# Patient Record
Sex: Female | Born: 1955 | Race: White | Hispanic: No | Marital: Married | Smoking: Current every day smoker
Health system: Southern US, Community
[De-identification: ages and names within clinical notes are randomized; demographics above are authoritative.]

## PROBLEM LIST (undated history)

## (undated) DIAGNOSIS — Z8614 Personal history of Methicillin resistant Staphylococcus aureus infection: Secondary | ICD-10-CM

## (undated) DIAGNOSIS — C801 Malignant (primary) neoplasm, unspecified: Secondary | ICD-10-CM

## (undated) DIAGNOSIS — I10 Essential (primary) hypertension: Secondary | ICD-10-CM

## (undated) DIAGNOSIS — I471 Supraventricular tachycardia, unspecified: Secondary | ICD-10-CM

## (undated) DIAGNOSIS — R112 Nausea with vomiting, unspecified: Secondary | ICD-10-CM

## (undated) DIAGNOSIS — N200 Calculus of kidney: Secondary | ICD-10-CM

## (undated) DIAGNOSIS — E119 Type 2 diabetes mellitus without complications: Secondary | ICD-10-CM

## (undated) DIAGNOSIS — Z9889 Other specified postprocedural states: Secondary | ICD-10-CM

## (undated) DIAGNOSIS — J449 Chronic obstructive pulmonary disease, unspecified: Secondary | ICD-10-CM

## (undated) DIAGNOSIS — T361X5A Adverse effect of cephalosporins and other beta-lactam antibiotics, initial encounter: Secondary | ICD-10-CM

## (undated) DIAGNOSIS — Z87442 Personal history of urinary calculi: Secondary | ICD-10-CM

## (undated) HISTORY — PX: ABDOMINAL HYSTERECTOMY: SHX81

## (undated) HISTORY — DX: Adverse effect of cephalosporins and other beta-lactam antibiotics, initial encounter: T36.1X5A

## (undated) HISTORY — DX: Chronic obstructive pulmonary disease, unspecified: J44.9

## (undated) HISTORY — PX: HERNIA REPAIR: SHX51

## (undated) HISTORY — PX: CHOLECYSTECTOMY: SHX55

## (undated) HISTORY — DX: Calculus of kidney: N20.0

## (undated) HISTORY — PX: OTHER SURGICAL HISTORY: SHX169

---

## 2005-03-16 ENCOUNTER — Ambulatory Visit: Payer: Self-pay | Admitting: Family Medicine

## 2006-09-14 ENCOUNTER — Emergency Department: Payer: Self-pay | Admitting: Emergency Medicine

## 2007-06-24 ENCOUNTER — Ambulatory Visit: Payer: Self-pay | Admitting: Internal Medicine

## 2008-02-20 ENCOUNTER — Ambulatory Visit: Payer: Self-pay | Admitting: Oncology

## 2008-02-23 ENCOUNTER — Ambulatory Visit: Payer: Self-pay | Admitting: Oncology

## 2008-03-22 ENCOUNTER — Ambulatory Visit: Payer: Self-pay | Admitting: Oncology

## 2008-05-22 ENCOUNTER — Ambulatory Visit: Payer: Self-pay | Admitting: Oncology

## 2008-05-28 ENCOUNTER — Ambulatory Visit: Payer: Self-pay | Admitting: Oncology

## 2008-05-31 ENCOUNTER — Ambulatory Visit: Payer: Self-pay | Admitting: Oncology

## 2008-06-11 ENCOUNTER — Ambulatory Visit: Payer: Self-pay | Admitting: Family Medicine

## 2008-06-22 ENCOUNTER — Ambulatory Visit: Payer: Self-pay | Admitting: Oncology

## 2008-08-18 ENCOUNTER — Ambulatory Visit: Payer: Self-pay | Admitting: Surgery

## 2008-08-26 ENCOUNTER — Ambulatory Visit: Payer: Self-pay | Admitting: Surgery

## 2008-11-26 ENCOUNTER — Ambulatory Visit: Payer: Self-pay | Admitting: Gastroenterology

## 2009-11-03 ENCOUNTER — Ambulatory Visit: Payer: Self-pay | Admitting: Family Medicine

## 2010-06-18 ENCOUNTER — Emergency Department: Payer: Self-pay | Admitting: Emergency Medicine

## 2011-11-27 ENCOUNTER — Ambulatory Visit: Payer: Self-pay | Admitting: Family Medicine

## 2011-11-29 ENCOUNTER — Ambulatory Visit: Payer: Self-pay | Admitting: Family Medicine

## 2012-02-04 ENCOUNTER — Ambulatory Visit: Payer: Self-pay | Admitting: Surgery

## 2012-02-11 ENCOUNTER — Ambulatory Visit: Payer: Self-pay | Admitting: Gastroenterology

## 2012-02-12 LAB — PATHOLOGY REPORT

## 2013-03-02 ENCOUNTER — Emergency Department: Payer: Self-pay | Admitting: Emergency Medicine

## 2013-03-02 LAB — BASIC METABOLIC PANEL
Anion Gap: 5 — ABNORMAL LOW (ref 7–16)
BUN: 14 mg/dL (ref 7–18)
Calcium, Total: 9.9 mg/dL (ref 8.5–10.1)
Chloride: 103 mmol/L (ref 98–107)
Co2: 29 mmol/L (ref 21–32)
Creatinine: 0.83 mg/dL (ref 0.60–1.30)
EGFR (African American): >60
EGFR (Non-African Amer.): >60
Glucose: 195 mg/dL — ABNORMAL HIGH (ref 65–99)
Sodium: 137 mmol/L (ref 136–145)

## 2013-03-02 LAB — URINALYSIS, COMPLETE
Bacteria: NONE SEEN
Glucose,UR: NEGATIVE mg/dL (ref 0–75)
Ph: 6 (ref 4.5–8.0)
Protein: 100
RBC,UR: 2883 /HPF (ref 0–5)
Specific Gravity: 1.024 (ref 1.003–1.030)
Squamous Epithelial: 5
WBC UR: 22 /HPF (ref 0–5)

## 2013-03-02 LAB — CBC
HCT: 41.6 % (ref 35.0–47.0)
MCH: 30.4 pg (ref 26.0–34.0)
MCHC: 35.1 g/dL (ref 32.0–36.0)
Platelet: 119 10*3/uL — ABNORMAL LOW (ref 150–440)
WBC: 11.4 10*3/uL — ABNORMAL HIGH (ref 3.6–11.0)

## 2013-03-05 ENCOUNTER — Ambulatory Visit: Payer: Self-pay | Admitting: Urology

## 2013-06-04 ENCOUNTER — Ambulatory Visit: Payer: Self-pay | Admitting: Surgery

## 2013-07-30 DIAGNOSIS — N200 Calculus of kidney: Secondary | ICD-10-CM | POA: Insufficient documentation

## 2014-02-16 DIAGNOSIS — R928 Other abnormal and inconclusive findings on diagnostic imaging of breast: Secondary | ICD-10-CM | POA: Insufficient documentation

## 2014-07-13 ENCOUNTER — Ambulatory Visit: Payer: Self-pay | Admitting: Surgery

## 2015-04-14 ENCOUNTER — Encounter: Payer: Self-pay | Admitting: Emergency Medicine

## 2015-04-14 ENCOUNTER — Ambulatory Visit
Admission: EM | Admit: 2015-04-14 | Discharge: 2015-04-14 | Disposition: A | Discharge status: Home / Self Care | Payer: 59 | Attending: Internal Medicine | Admitting: Internal Medicine

## 2015-04-14 ENCOUNTER — Ambulatory Visit: Payer: 59

## 2015-04-14 DIAGNOSIS — M4682 Other specified inflammatory spondylopathies, cervical region: Secondary | ICD-10-CM | POA: Diagnosis not present

## 2015-04-14 DIAGNOSIS — M4722 Other spondylosis with radiculopathy, cervical region: Secondary | ICD-10-CM | POA: Diagnosis not present

## 2015-04-14 DIAGNOSIS — Z794 Long term (current) use of insulin: Secondary | ICD-10-CM | POA: Diagnosis not present

## 2015-04-14 DIAGNOSIS — M549 Dorsalgia, unspecified: Secondary | ICD-10-CM | POA: Diagnosis present

## 2015-04-14 DIAGNOSIS — M4692 Unspecified inflammatory spondylopathy, cervical region: Secondary | ICD-10-CM

## 2015-04-14 DIAGNOSIS — Z79899 Other long term (current) drug therapy: Secondary | ICD-10-CM | POA: Insufficient documentation

## 2015-04-14 DIAGNOSIS — E119 Type 2 diabetes mellitus without complications: Secondary | ICD-10-CM | POA: Insufficient documentation

## 2015-04-14 DIAGNOSIS — M5412 Radiculopathy, cervical region: Secondary | ICD-10-CM

## 2015-04-14 DIAGNOSIS — Z7982 Long term (current) use of aspirin: Secondary | ICD-10-CM | POA: Diagnosis not present

## 2015-04-14 DIAGNOSIS — F17210 Nicotine dependence, cigarettes, uncomplicated: Secondary | ICD-10-CM | POA: Diagnosis not present

## 2015-04-14 HISTORY — DX: Type 2 diabetes mellitus without complications: E11.9

## 2015-04-14 MED ORDER — NAPROXEN 500 MG PO TABS: 500.0000 mg | ORAL_TABLET | Freq: Two times a day (BID) | ORAL | Status: DC

## 2015-04-14 MED ORDER — DIAZEPAM 2 MG PO TABS: 2.0000 mg | ORAL_TABLET | Freq: Three times a day (TID) | ORAL | Status: DC

## 2015-04-14 MED ORDER — KETOROLAC TROMETHAMINE 60 MG/2ML IM SOLN
60.0000 mg | Freq: Once | INTRAMUSCULAR | Status: AC
  Administered 2015-04-14: 60 mg via INTRAMUSCULAR

## 2015-04-14 NOTE — Discharge Instructions (Signed)

## 2015-04-14 NOTE — ED Notes (Signed)
Patient c/o neck pain that goes down into her upper left side of her back and left shoulder for a week.  Patient states that she thinks she might have pulled a muscle.

## 2015-04-14 NOTE — ED Provider Notes (Signed)
CSN: [id number]     Arrival date & time 04/14/15  1209 History   First MD Initiated Contact with Patient 04/14/15 1302     Chief Complaint  Patient presents with  . 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 He states that bout one week ago 350 pound patient had fainted and she assisted in helping to move the patient. She felt neck pain at that time it was not severe. She then had her grandson who is one half years old visit her in the process of frequent lifting of him she noticed an exacerbation of her pain. Now her pain is in her neck mostly left-sided but she feels it mostly in the left trapezius with some extension into the shoulder but not down into her elbow level. Denies any numbness or tingling into her fingers. Is not as noticed any decrease in strength or clumsiness. Turning her head to her left definitely makes her pain more noticeable. She states that she had injured the right side of her neck in the past had sought chiropractic which after numerous visits was able to become pain-free without recurrence. Past Medical History  Diagnosis Date  . Diabetes mellitus without complication    Past Surgical History  Procedure Laterality Date  . Cholecystectomy    . Hernia repair    . Abdominal hysterectomy    . Cesarean section     History reviewed. No pertinent family history. History  Substance Use Topics  . Smoking status: Current Every Day Smoker -- 1.00 packs/day  . Smokeless tobacco: Never Used  . Alcohol Use: No   OB History    No data available     Review of Systems  Musculoskeletal: Positive for myalgias, neck pain and neck stiffness.  All other systems reviewed and are negative.   Allergies  Review of patient's allergies indicates no known allergies.  Home Medications   Prior to Admission medications    Medication Sig Start Date End Date Taking? Authorizing Provider  aspirin 81 MG tablet Take 81 mg by mouth daily.   Yes Historical Provider, MD  atorvastatin (LIPITOR) 40 MG tablet Take 40 mg by mouth daily.   Yes Historical Provider, MD  b complex vitamins tablet Take 1 tablet by mouth daily.   Yes Historical Provider, MD  enalapril (VASOTEC) 2.5 MG tablet Take 2.5 mg by mouth daily.   Yes Historical Provider, MD  glipiZIDE (GLUCOTROL XL) 10 MG 24 hr tablet Take 10 mg by mouth daily with breakfast.   Yes Historical Provider, MD  Insulin Detemir (LEVEMIR FLEXPEN) 100 UNIT/ML Pen Inject 50 Units into the skin daily at 10 pm.   Yes Historical Provider, MD  insulin lispro (HUMALOG) 100 UNIT/ML injection Inject 10 Units into the skin once.   Yes Historical Provider, MD  metFORMIN (GLUCOPHAGE-XR) 500 MG 24 hr tablet Take 1,500 mg by mouth daily with breakfast.   Yes Historical Provider, MD  diazepam (VALIUM) 2 MG tablet Take 1 tablet (2 mg total) by mouth 3 (three) times daily. 04/14/15   Lorin Picket, PA-C  naproxen (NAPROSYN) 500 MG tablet Take 1 tablet (500 mg total) by mouth 2 (two) times daily with a meal. 04/14/15   Lorin Picket, PA-C   BP 136/73 mmHg  Pulse 89  Temp(Src) 96.9 F (36.1 C) (Tympanic)  Resp 16  Ht 5\' 3"  (1.6 m)  Wt 170 lb (77.111 kg)  BMI 30.12 kg/m2  SpO2 98% Physical Exam  Constitutional: She is oriented to person, place, and time. She appears well-developed and well-nourished.  HENT:  Head: Normocephalic and atraumatic.  Right Ear: External ear normal.  Left Ear: External ear normal.  Eyes: EOM are normal. Pupils are equal, round, and reactive to light.  Neck:  Examination of neck shows limited range of motion particularly with extension and left-sided rotation. Most pain is elicited with flexion and extension which causes her to have pain in the left trapezius. Upper extremity DTRs are 2+ over 4 and bilaterally symmetrical. Strength is intact to clinical  stressing and sensation is intact to light touch. There is muscle spasm present and tenderness over small area of the trapezius which when pressed reproduces her most significant pain. She perceives some radiation into her shoulder with this maneuver.  Neurological: She is alert and oriented to person, place, and time. She has normal reflexes.  Skin: Skin is warm.  Psychiatric: She has a normal mood and affect. Her behavior is normal. Judgment and thought content normal.  Nursing note and vitals reviewed.   ED Course  Procedures (including critical care time) Labs Review Labs Reviewed - No data to display  Imaging Review Dg Cervical Spine Complete  04/14/2015   CLINICAL DATA:  Injured lifting with neck pain  EXAM: CERVICAL SPINE  4+ VIEWS  COMPARISON:  None.  FINDINGS: The cervical vertebrae are slightly straightened in alignment. There is degenerative disc disease at C6-7 where there is loss of disc space and sclerosis with spurring. The remainder of intervertebral disc spaces appear normal. No prevertebral soft tissue swelling is seen. On oblique views, there is moderate foraminal narrowing bilaterally at C6-7. The odontoid process is intact. The lung apices are clear.  IMPRESSION: Straightened alignment with degenerative disc disease at C6-7 with moderate bilateral foraminal narrowing at that level.   Electronically Signed   By: Ivar Drape M.D.   On: 04/14/2015 13:43   13:18:43 Orders Placed WR  ketorolac (TORADOL) injection 60 mg ; DG Cervical Spine Complete       MDM   1. Cervical spondylitis with radiculitis    New Prescriptions   DIAZEPAM (VALIUM) 2 MG TABLET    Take 1 tablet (2 mg total) by mouth 3 (three) times daily.   NAPROXEN (NAPROSYN) 500 MG TABLET    Take 1 tablet (500 mg total) by mouth 2 (two) times daily with a meal.  Plan: 1. Test/x-ray results and diagnosis reviewed with patient 2. rx as per orders; risks, benefits, potential side effects reviewed with patient 3.  Recommend supportive treatment with rest,heat 4. F/u prn if symptoms worsen or don't improve. F/U PCP     Lorin Picket, PA-C 04/14/15 1404

## 2015-04-16 ENCOUNTER — Encounter: Payer: Self-pay | Admitting: Emergency Medicine

## 2015-04-16 ENCOUNTER — Emergency Department: Payer: 59

## 2015-04-16 ENCOUNTER — Emergency Department
Admission: EM | Admit: 2015-04-16 | Discharge: 2015-04-16 | Disposition: A | Discharge status: Home / Self Care | Payer: 59 | Attending: Emergency Medicine | Admitting: Emergency Medicine

## 2015-04-16 DIAGNOSIS — Z7952 Long term (current) use of systemic steroids: Secondary | ICD-10-CM | POA: Diagnosis not present

## 2015-04-16 DIAGNOSIS — Z720 Tobacco use: Secondary | ICD-10-CM | POA: Insufficient documentation

## 2015-04-16 DIAGNOSIS — Z794 Long term (current) use of insulin: Secondary | ICD-10-CM | POA: Diagnosis not present

## 2015-04-16 DIAGNOSIS — Z7982 Long term (current) use of aspirin: Secondary | ICD-10-CM | POA: Insufficient documentation

## 2015-04-16 DIAGNOSIS — M2550 Pain in unspecified joint: Secondary | ICD-10-CM

## 2015-04-16 DIAGNOSIS — Z79899 Other long term (current) drug therapy: Secondary | ICD-10-CM | POA: Diagnosis not present

## 2015-04-16 DIAGNOSIS — M542 Cervicalgia: Secondary | ICD-10-CM | POA: Insufficient documentation

## 2015-04-16 DIAGNOSIS — E119 Type 2 diabetes mellitus without complications: Secondary | ICD-10-CM | POA: Insufficient documentation

## 2015-04-16 MED ORDER — PREDNISONE 10 MG PO TABS: 50.0000 mg | ORAL_TABLET | Freq: Every day | ORAL | Status: DC

## 2015-04-16 MED ORDER — IBUPROFEN 800 MG PO TABS: 800.0000 mg | ORAL_TABLET | Freq: Three times a day (TID) | ORAL | Status: DC | PRN

## 2015-04-16 MED ORDER — MEPERIDINE HCL 50 MG/ML IJ SOLN
50.0000 mg | Freq: Once | INTRAMUSCULAR | Status: AC
  Administered 2015-04-16: 50 mg via INTRAMUSCULAR

## 2015-04-16 MED ORDER — PROMETHAZINE HCL 25 MG/ML IJ SOLN
25.0000 mg | Freq: Once | INTRAMUSCULAR | Status: AC
  Administered 2015-04-16: 25 mg via INTRAMUSCULAR

## 2015-04-16 MED ORDER — HYDROCODONE-ACETAMINOPHEN 5-325 MG PO TABS: 1.0000 | ORAL_TABLET | ORAL | Status: DC | PRN

## 2015-04-16 MED ORDER — CYCLOBENZAPRINE HCL 10 MG PO TABS: 10.0000 mg | ORAL_TABLET | Freq: Three times a day (TID) | ORAL | Status: DC | PRN

## 2015-04-16 MED ORDER — DIAZEPAM 5 MG PO TABS
5.0000 mg | ORAL_TABLET | Freq: Once | ORAL | Status: AC
  Administered 2015-04-16: 5 mg via ORAL

## 2015-04-16 MED ORDER — PROMETHAZINE HCL 25 MG/ML IJ SOLN
INTRAMUSCULAR | Status: AC
  Administered 2015-04-16: 25 mg via INTRAMUSCULAR
  Filled 2015-04-16: qty 1

## 2015-04-16 MED ORDER — DIAZEPAM 5 MG PO TABS
ORAL_TABLET | ORAL | Status: AC
  Administered 2015-04-16: 5 mg via ORAL
  Filled 2015-04-16: qty 1

## 2015-04-16 MED ORDER — MEPERIDINE HCL 50 MG/ML IJ SOLN
INTRAMUSCULAR | Status: AC
  Administered 2015-04-16: 50 mg via INTRAMUSCULAR
  Filled 2015-04-16: qty 1

## 2015-04-16 NOTE — ED Provider Notes (Signed)
[facility] Emergency Department Provider Note  ____________________________________________  Time seen: Approximately 8:09 AM  I have reviewed the triage vital signs and the nursing notes.   HISTORY  Chief Complaint Neck Pain    HPI Dawn Farrell is a 59 y.o. female who presents to the emergency room for evaluation of continued neck and left shoulder pain. Patient was seen at urgent care 2 days ago for same given Valium and naproxen with no relief. Patient states pain is 10 over 10 unable to sleep last night. Denies any chest pains, nausea, vomiting,.   Past Medical History  Diagnosis Date  . Diabetes mellitus without complication     There are no active problems to display for this patient.   Past Surgical History  Procedure Laterality Date  . Cholecystectomy    . Hernia repair    . Abdominal hysterectomy    . Cesarean section      Current Outpatient Rx  Name  Route  Sig  Dispense  Refill  . aspirin 81 MG tablet   Oral   Take 81 mg by mouth daily.         [name] atorvastatin (LIPITOR) 40 MG tablet   Oral   Take 40 mg by mouth daily.         [name] b complex vitamins tablet   Oral   Take 1 tablet by mouth daily.         . cyclobenzaprine (FLEXERIL) 10 MG tablet   Oral   Take 1 tablet (10 mg total) by mouth every 8 (eight) hours as needed for muscle spasms.   30 tablet   1   . enalapril (VASOTEC) 2.5 MG tablet   Oral   Take 2.5 mg by mouth daily.         [name] glipiZIDE (GLUCOTROL XL) 10 MG 24 hr tablet   Oral   Take 10 mg by mouth daily with breakfast.         . HYDROcodone-acetaminophen (NORCO) 5-325 MG per tablet   Oral   Take 1-2 tablets by mouth every 4 (four) hours as needed for moderate pain.   15 tablet   0   . ibuprofen (ADVIL,MOTRIN) 800 MG tablet   Oral   Take 1 tablet (800 mg total) by mouth every 8 (eight) hours as needed.   30 tablet   0   . Insulin Detemir (LEVEMIR FLEXPEN) 100 UNIT/ML Pen   Subcutaneous  Inject 50 Units into the skin daily at 10 pm.         . insulin lispro (HUMALOG) 100 UNIT/ML injection   Subcutaneous   Inject 10 Units into the skin once.         . metFORMIN (GLUCOPHAGE-XR) 500 MG 24 hr tablet   Oral   Take 1,500 mg by mouth daily with breakfast.         . predniSONE (DELTASONE) 10 MG tablet   Oral   Take 5 tablets (50 mg total) by mouth daily with breakfast.   50 tablet   0     Allergies Review of patient's allergies indicates no known allergies.  No family history on file.  Social History History  Substance Use Topics  . Smoking status: Current Every Day Smoker -- 1.00 packs/day  . Smokeless tobacco: Never Used  . Alcohol Use: No    Review of Systems Constitutional: No fever/chills Eyes: No visual changes. ENT: No sore throat. Cardiovascular: Denies chest pain. Respiratory: Denies shortness of breath.  Gastrointestinal: No abdominal pain.  No nausea, no vomiting.  No diarrhea.  No constipation. Genitourinary: Negative for dysuria. Musculoskeletal: Positive for left scapular paraspinal cervical pain. Skin: Negative for rash. Neurological: Negative for headaches, focal weakness or numbness.  10-point ROS otherwise negative.  ____________________________________________   PHYSICAL EXAM:  VITAL SIGNS: ED Triage Vitals  Enc Vitals Group     BP 04/16/15 0756 154/87 mmHg     Pulse Rate 04/16/15 0756 83     Resp 04/16/15 0756 18     Temp 04/16/15 0756 98 F (36.7 C)     Temp Source 04/16/15 0756 Oral     SpO2 04/16/15 0756 97 %     Weight 04/16/15 0756 170 lb (77.111 kg)     Height 04/16/15 0756 5\' 3"  (1.6 m)     Head Cir --      Peak Flow --      Pain Score 04/16/15 0756 8     Pain Loc --      Pain Edu? --      Excl. in [place]? --     Constitutional: Alert and oriented. Well appearing and in no acute distress. Eyes: Conjunctivae are normal. PERRL. EOMI. Head: Atraumatic. Nose: No congestion/rhinnorhea. Mouth/Throat: Mucous  membranes are moist.  Oropharynx non-erythematous. Neck: No stridor.  No spinal tenderness. Cardiovascular: Normal rate, regular rhythm. Grossly normal heart sounds.  Good peripheral circulation. Respiratory: Normal respiratory effort.  No retractions. Lungs CTAB. Gastrointestinal: Soft and nontender. No distention. No abdominal bruits. No CVA tenderness. Musculoskeletal: No lower extremity tenderness nor edema.  No joint effusions. Increased pain with flexion and extension of left arm and shoulder. Neurologic:  Normal speech and language. No gross focal neurologic deficits are appreciated. Speech is normal. No gait instability. Skin:  Skin is warm, dry and intact. No rash noted. Psychiatric: Mood and affect are normal. Speech and behavior are normal.  ____________________________________________   LABS (all labs ordered are listed, but only abnormal results are displayed)  Labs Reviewed - No data to display ____________________________________________  EKG  Not applicable ____________________________________________  RADIOLOGY  Cervical spine Results reviewed from 6/23 by myself. Left shoulder x-rays interpreted by myself as negative. ____________________________________________   PROCEDURES  Procedure(s) performed: None  10:00-After 30 minutes, reexamined patient no relief with Demerol and Phenergan. Valium 5 mg by mouth ordered as well as left shoulder x-rays. 1030-she reports improvement. We'll discharge home.   Critical Care performed: No  ____________________________________________   INITIAL IMPRESSION / ASSESSMENT AND PLAN / ED COURSE  Pertinent labs & imaging results that were available during my care of the patient were reviewed by me and considered in my medical decision making (see chart for details).  Chronic arthralgia exacerbation acutely. Rx given for Flexeril 10 mg, Motrin 800 mg, prednisone, and hydrocodone.  Patient verbalizes understanding and will  follow-up with any worsening symptomology. She denies any other emergency medical complaints at this visit. ____________________________________________   FINAL CLINICAL IMPRESSION(S) / ED DIAGNOSES  Final diagnoses:  Arthralgia      Arlyss Repress, PA-C 04/16/15 [place], MD 04/16/15 1550

## 2015-04-16 NOTE — Discharge Instructions (Signed)

## 2015-04-16 NOTE — ED Notes (Signed)
Pt states about 1 week ago she was moving a patient at work and then she went to pick up her grandson, states that night she started having posterior neck pain, states she had xray preformed on Thursday and [facility] and dx with "pinched nerve" in her neck, states pain is getting worse and now is radiating down to left elbow area, states pain is worse with movement

## 2015-04-16 NOTE — ED Notes (Signed)
States she was seen for pain to neck couple of days ago. Now states pain is moving from neck into left arm.

## 2015-05-11 DIAGNOSIS — M5012 Mid-cervical disc disorder, unspecified level: Secondary | ICD-10-CM | POA: Insufficient documentation

## 2016-04-23 ENCOUNTER — Encounter: Payer: Self-pay | Admitting: *Deleted

## 2016-04-23 ENCOUNTER — Ambulatory Visit
Admission: EM | Admit: 2016-04-23 | Discharge: 2016-04-23 | Disposition: A | Discharge status: Hospice | Payer: 59 | Attending: Family Medicine | Admitting: Family Medicine

## 2016-04-23 ENCOUNTER — Emergency Department
Admission: EM | Admit: 2016-04-23 | Discharge: 2016-04-23 | Disposition: A | Discharge status: Home / Self Care | Payer: 59 | Attending: Emergency Medicine | Admitting: Emergency Medicine

## 2016-04-23 DIAGNOSIS — E876 Hypokalemia: Secondary | ICD-10-CM

## 2016-04-23 DIAGNOSIS — I471 Supraventricular tachycardia, unspecified: Secondary | ICD-10-CM

## 2016-04-23 DIAGNOSIS — R079 Chest pain, unspecified: Secondary | ICD-10-CM

## 2016-04-23 DIAGNOSIS — Z7982 Long term (current) use of aspirin: Secondary | ICD-10-CM | POA: Diagnosis not present

## 2016-04-23 DIAGNOSIS — Z794 Long term (current) use of insulin: Secondary | ICD-10-CM | POA: Insufficient documentation

## 2016-04-23 DIAGNOSIS — Z7984 Long term (current) use of oral hypoglycemic drugs: Secondary | ICD-10-CM | POA: Diagnosis not present

## 2016-04-23 DIAGNOSIS — E119 Type 2 diabetes mellitus without complications: Secondary | ICD-10-CM | POA: Diagnosis not present

## 2016-04-23 DIAGNOSIS — F17200 Nicotine dependence, unspecified, uncomplicated: Secondary | ICD-10-CM | POA: Diagnosis not present

## 2016-04-23 DIAGNOSIS — R002 Palpitations: Secondary | ICD-10-CM | POA: Diagnosis present

## 2016-04-23 LAB — CBC WITH DIFFERENTIAL/PLATELET
BASOS ABS: 0.1 10*3/uL (ref 0–0.1)
Basophils Relative: 1 %
Eosinophils Absolute: 0.5 10*3/uL (ref 0–0.7)
Eosinophils Relative: 5 %
HEMATOCRIT: 42.1 % (ref 35.0–47.0)
Hemoglobin: 14.3 g/dL (ref 12.0–16.0)
LYMPHS ABS: 2.4 10*3/uL (ref 1.0–3.6)
Lymphocytes Relative: 25 %
MCH: 29.1 pg (ref 26.0–34.0)
MCHC: 34 g/dL (ref 32.0–36.0)
MCV: 85.4 fL (ref 80.0–100.0)
MONO ABS: 0.5 10*3/uL (ref 0.2–0.9)
Monocytes Relative: 5 %
NEUTROS ABS: 5.9 10*3/uL (ref 1.4–6.5)
Neutrophils Relative %: 64 %
Platelets: 121 10*3/uL — ABNORMAL LOW (ref 150–440)
RBC: 4.93 MIL/uL (ref 3.80–5.20)
RDW: 14.1 % (ref 11.5–14.5)
WBC: 9.3 10*3/uL (ref 3.6–11.0)

## 2016-04-23 LAB — BASIC METABOLIC PANEL
ANION GAP: 6 (ref 5–15)
BUN: 15 mg/dL (ref 6–20)
CHLORIDE: 111 mmol/L (ref 101–111)
CO2: 22 mmol/L (ref 22–32)
CREATININE: 0.67 mg/dL (ref 0.44–1.00)
Calcium: 7.9 mg/dL — ABNORMAL LOW (ref 8.9–10.3)
GFR calc non Af Amer: >60 mL/min (ref >60)
Glucose, Bld: 184 mg/dL — ABNORMAL HIGH (ref 65–99)
POTASSIUM: 3 mmol/L — AB (ref 3.5–5.1)
SODIUM: 139 mmol/L (ref 135–145)

## 2016-04-23 LAB — BRAIN NATRIURETIC PEPTIDE: B Natriuretic Peptide: 16 pg/mL (ref 0.0–100.0)

## 2016-04-23 LAB — TROPONIN I

## 2016-04-23 MED ORDER — POTASSIUM CHLORIDE CRYS ER 20 MEQ PO TBCR
40.0000 meq | EXTENDED_RELEASE_TABLET | Freq: Once | ORAL | Status: AC
  Administered 2016-04-23: 40 meq via ORAL
  Filled 2016-04-23: qty 2

## 2016-04-23 MED ORDER — ADENOSINE 6 MG/2ML IV SOLN
6.0000 mg | Freq: Once | INTRAVENOUS | Status: AC
  Administered 2016-04-23: 6 mg via INTRAVENOUS

## 2016-04-23 MED ORDER — METOPROLOL SUCCINATE ER 50 MG PO TB24
50.0000 mg | ORAL_TABLET | Freq: Every day | ORAL | Status: DC
  Administered 2016-04-23: 50 mg via ORAL
  Filled 2016-04-23: qty 1

## 2016-04-23 MED ORDER — METOPROLOL SUCCINATE ER 50 MG PO TB24: 50.0000 mg | ORAL_TABLET | Freq: Every day | ORAL | Status: DC

## 2016-04-23 MED ORDER — ASPIRIN 81 MG PO CHEW
324.0000 mg | CHEWABLE_TABLET | Freq: Once | ORAL | Status: AC
  Administered 2016-04-23: 324 mg via ORAL

## 2016-04-23 MED ORDER — SODIUM CHLORIDE 0.9 % IV BOLUS (SEPSIS)
1000.0000 mL | Freq: Once | INTRAVENOUS | Status: AC
  Administered 2016-04-23: 1000 mL via INTRAVENOUS

## 2016-04-23 NOTE — ED Notes (Signed)
Iv fluids infusing.   Family with pt   meds given.

## 2016-04-23 NOTE — Discharge Instructions (Signed)
Paroxysmal Supraventricular Tachycardia Paroxysmal supraventricular tachycardia (PSVT) is when your heart beats very quickly and then suddenly stops beating so quickly. You may or may not have any symptoms when this occurs. It is usually not dangerous. It can lead to problems if it happens often or it lasts a long time. HOME CARE   Take medicines only as told by your doctor.  Avoid caffeine or limit how much of it you consume as told by your doctor. Caffeine is found in coffee, tea, soda, and chocolate.  Avoid alcohol or limit how much of it you drink as told by your doctor.  Do not smoke.  Try to get at least 7 hours of sleep each night.  Find healthy ways to reduce stress.  Do self-treatments as told by your doctor to slow down your heart (vagus nerve stimulation). Your doctor may tell you to:  Hold your breath and push, as though you are going to the bathroom.  Rub an area on one side of your neck.  Bend forward with your head between your legs.  Bend forward with your head between your legs, then cough.  Rub your eyeballs with your eyes closed.  Maintain a healthy weight.  Get some exercise on most days. Ask your doctor about some good activities for you. GET HELP IF:  You are having episodes of a fast heartbeat more often.  Your episodes are lasting longer.  Your self-treatments to slow down your heart are no longer helping.  You have new symptoms during an episode. GET HELP RIGHT AWAY IF:  You have chest pain.  You have trouble breathing.  You have an episode of a fast heartbeat that lasts longer than 20 minutes.  You pass out (faint). These symptoms may be an emergency. Do not wait to see if the symptoms will go away. Get medical help right away. Call your local emergency services (911 in the [place] Do not drive yourself to the hospital.   This information is not intended to replace advice given to you by your health care provider. Make sure you discuss any  questions you have with your health care provider.   Document Released: 10/08/2005 Document Revised: 10/29/2014 Document Reviewed: 03/18/2014 Elsevier Interactive Patient Education 2016 [employer].  Hypokalemia Hypokalemia means that the amount of potassium in the blood is lower than normal.Potassium is a chemical, called an electrolyte, that helps regulate the amount of fluid in the body. It also stimulates muscle contraction and helps nerves function properly.Most of the body's potassium is inside of cells, and only a very small amount is in the blood. Because the amount in the blood is so small, minor changes can be life-threatening. CAUSES  Antibiotics.  Diarrhea or vomiting.  Using laxatives too much, which can cause diarrhea.  Chronic kidney disease.  Water pills (diuretics).  Eating disorders (bulimia).  Low magnesium level.  Sweating a lot. SIGNS AND SYMPTOMS  Weakness.  Constipation.  Fatigue.  Muscle cramps.  Mental confusion.  Skipped heartbeats or irregular heartbeat (palpitations).  Tingling or numbness. DIAGNOSIS  Your health care provider can diagnose hypokalemia with blood tests. In addition to checking your potassium level, your health care provider may also check other lab tests. TREATMENT Hypokalemia can be treated with potassium supplements taken by mouth or adjustments in your current medicines. If your potassium level is very low, you may need to get potassium through a vein (IV) and be monitored in the hospital. A diet high in potassium is also helpful. Foods  high in potassium are:  Nuts, such as peanuts and pistachios.  Seeds, such as sunflower seeds and pumpkin seeds.  Peas, lentils, and lima beans.  Whole grain and bran cereals and breads.  Fresh fruit and vegetables, such as apricots, avocado, bananas, cantaloupe, kiwi, oranges, tomatoes, asparagus, and potatoes.  Orange and tomato juices.  Red meats.  Fruit yogurt. HOME CARE  INSTRUCTIONS  Take all medicines as prescribed by your health care provider.  Maintain a healthy diet by including nutritious food, such as fruits, vegetables, nuts, whole grains, and lean meats.  If you are taking a laxative, be sure to follow the directions on the label. SEEK MEDICAL CARE IF:  Your weakness gets worse.  You feel your heart pounding or racing.  You are vomiting or having diarrhea.  You are diabetic and having trouble keeping your blood glucose in the normal range. SEEK IMMEDIATE MEDICAL CARE IF:  You have chest pain, shortness of breath, or dizziness.  You are vomiting or having diarrhea for more than 2 days.  You faint. MAKE SURE YOU:   Understand these instructions.  Will watch your condition.  Will get help right away if you are not doing well or get worse.   This information is not intended to replace advice given to you by your health care provider. Make sure you discuss any questions you have with your health care provider.   Document Released: 10/08/2005 Document Revised: 10/29/2014 Document Reviewed: 04/10/2013 Elsevier Interactive Patient Education [employer].

## 2016-04-23 NOTE — ED Notes (Signed)
Pt brought in by ems from [facility] urgent care.  Pt had episode of rapid heart rate.  Given 6mg  adenisone with conversion.  Pt alert. Iv in place.  md at bedside.

## 2016-04-23 NOTE — ED Provider Notes (Signed)
CSN: [id number]     Arrival date & time 04/23/16  1527 History   First MD Initiated Contact with Patient 04/23/16 1533   Nurses notes were reviewed. Chief Complaint  Patient presents with  . Tachycardia    Patient is here due to tachycardia and shortness of breath. Patient reports in the usual state of health when about 2:30 orabout an hour from her presentation here she noticed that she was having difficulty breathing and experiencing tachycardia. She reports having some mild tachycardia before in the past when she is anxious she does have a history of anxiety but never having anything like she had today. 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 Put a monitor on found her heart rate was over 190 pulse ox was 96 so she came here to be evaluated and treated. She states that normally her blood pressure about 110 highest and that was also elevated. Past history she is a diabetic. She still smokes and she's had a cholecystectomy hernia repair abdominal hysterectomy and 2 C-sections. Mother has had heart failure father has had heart failure as well.   She reports having chest pain as well which is a heaviness and fullness she describes over her chest with some radiation to her neck and a smothering sensation over her chest also. She reports also sweating heavily since she's been here.    (Consider location/radiation/quality/duration/timing/severity/associated sxs/prior Treatment) Patient is a 60 y.o. female presenting with palpitations. The history is provided by the patient. No language interpreter was used.  Palpitations Palpitations quality:  Fast Onset quality:  Sudden Duration:  2 hours Timing:  Constant Progression:  Worsening Chronicity:  New Context: anxiety   Context: not blood loss, not illicit drugs, not nicotine and not stimulant use   Relieved by:  Nothing Worsened by:  Nothing Ineffective treatments:  None tried Associated symptoms: chest pain, chest pressure, diaphoresis,  shortness of breath and weakness   Risk factors: diabetes mellitus and stress   Risk factors: no heart disease, no hx of atrial fibrillation, no hx of DVT, no hx of PE, no hyperthyroidism and no OTC sinus medications     Past Medical History  Diagnosis Date  . Diabetes mellitus without complication [facility])    Past Surgical History  Procedure Laterality Date  . Cholecystectomy    . Hernia repair    . Abdominal hysterectomy    . Cesarean section     Family History  Problem Relation Age of Onset  . Heart failure Mother   . Heart failure Father    Social History  Substance Use Topics  . Smoking status: Current Every Day Smoker -- 1.00 packs/day  . Smokeless tobacco: Never Used  . Alcohol Use: No   OB History    No data available     Review of Systems  Constitutional: Positive for diaphoresis.  Respiratory: Positive for shortness of breath.   Cardiovascular: Positive for chest pain and palpitations.  Neurological: Positive for weakness.  All other systems reviewed and are negative.   Allergies  Review of patient's allergies indicates no known allergies.  Home Medications   Prior to Admission medications   Medication Sig Start Date End Date Taking? Authorizing Provider  aspirin 81 MG tablet Take 81 mg by mouth daily.   Yes Historical Provider, MD  atorvastatin (LIPITOR) 40 MG tablet Take 40 mg by mouth daily.   Yes Historical Provider, MD  enalapril (VASOTEC) 2.5 MG tablet Take 2.5 mg by mouth daily.  Yes Historical Provider, MD  glipiZIDE (GLUCOTROL XL) 10 MG 24 hr tablet Take 10 mg by mouth daily with breakfast.   Yes Historical Provider, MD  Insulin Detemir (LEVEMIR FLEXPEN) 100 UNIT/ML Pen Inject 50 Units into the skin daily at 10 pm.   Yes Historical Provider, MD  metFORMIN (GLUCOPHAGE-XR) 500 MG 24 hr tablet Take 1,500 mg by mouth daily with breakfast.   Yes Historical Provider, MD  b complex vitamins tablet Take 1 tablet by mouth daily.    Historical Provider, MD   cyclobenzaprine (FLEXERIL) 10 MG tablet Take 1 tablet (10 mg total) by mouth every 8 (eight) hours as needed for muscle spasms. 04/16/15   Arlyss Repress, PA-C  HYDROcodone-acetaminophen (NORCO) 5-325 MG per tablet Take 1-2 tablets by mouth every 4 (four) hours as needed for moderate pain. 04/16/15   Pierce Crane Beers, PA-C  ibuprofen (ADVIL,MOTRIN) 800 MG tablet Take 1 tablet (800 mg total) by mouth every 8 (eight) hours as needed. 04/16/15   Pierce Crane Beers, PA-C  insulin lispro (HUMALOG) 100 UNIT/ML injection Inject 10 Units into the skin once.    Historical Provider, MD  predniSONE (DELTASONE) 10 MG tablet Take 5 tablets (50 mg total) by mouth daily with breakfast. 04/16/15   Arlyss Repress, PA-C   Meds Ordered and Administered this Visit   Medications  adenosine (ADENOCARD) 6 MG/2ML injection 6 mg (6 mg Intravenous Given 04/23/16 1544)  aspirin chewable tablet 324 mg (324 mg Oral Given 04/23/16 1544)    BP 146/94 mmHg  Pulse 107 No data found.   Physical Exam  Constitutional: She is oriented to person, place, and time. She appears well-developed and well-nourished.  HENT:  Head: Normocephalic.  Eyes: Pupils are equal, round, and reactive to light.  Neck: Normal range of motion. Neck supple.  Cardiovascular: An irregularly irregular rhythm present. Tachycardia present.   Pulmonary/Chest: Breath sounds normal.  Abdominal: Soft. Bowel sounds are normal.  Musculoskeletal: Normal range of motion. She exhibits no edema.  Neurological: She is alert and oriented to person, place, and time.  Skin: Skin is warm and dry.  Psychiatric: Her mood appears anxious.  Vitals reviewed.   ED Course  Procedures (including critical care time)  Labs Review Labs Reviewed - No data to display  Imaging Review No results found.   Visual Acuity Review  Right Eye Distance:   Left Eye Distance:   Bilateral Distance:    Right Eye Near:   Left Eye Near:    Bilateral Near:         MDM   1.  Sustained SVT (HCC)   2. Chest pain radiating to jaw    Patient was given 4 baby aspirin. Saline lock was started and IV adenosine was given 6 mg IV. Patient states felt like a horse kicked her course but heart rate went from 190-200 to 110 with marked improvement of her shortness of breath and resolution of the diaphoresis. EMS was notified and they have transferring her to [facility] ED charge nurse Colletta Maryland was notified of the pending arrival..   ED ECG REPORT I, Cecylia Brazill H, the attending physician, personally viewed and interpreted this ECG.   Date: 04/23/2016  EKG Time: 15:36:56  Rate: 199  Rhythm: there are no previous tracings available for comparison, prolonged QT interval, nonspecific intraventricular block  Axis:52   Intervals:nonspecific intraventricular conduction delay  ST&T Change: Marked ST depression present throughout the EKG     After the adenosine was  given she still had sinus tachycardia 110of testicular abnormalities but the ST depressions were present for her cleared.   Note: This dictation was prepared with Dragon dictation along with smaller phrase technology. Any transcriptional errors that result from this process are unintentional.    Frederich Cha, MD 04/23/16 [phone]

## 2016-04-23 NOTE — ED Provider Notes (Signed)
[facility] Emergency Department Provider Note   ____________________________________________  Time seen: seen upon arrival to the emergency department  I have reviewed the triage vital signs and the nursing notes.   HISTORY  Chief Complaint Tachycardia   HPI Dawn Farrell is a 60 y.o. female with a history of diabetes who is presenting to the emergency department today after being found to be in SVT. At about 225 she felt a sudden onset of lightheadedness as well as palpitations. Says that she also felt a heaviness to the back of her throat. She also felt weakness to her bilateral arms. Said that she thought she was just stress. However, after the feeling persisted she checked her pulse about 20 minutes later and was found to have a heart rate at about 200. She then drove to urgent care or she was found to be in SVT. She was given 6 g of adenosine and converted to normal sinus rhythm. She says that "I feel great" at this time. Denies any complaints after conversion with adenosine. Says that she has a family history of heart disease. Says that she smokes but denies any drinking or drug use. Does not take beta blocker. Says that she has been sleeping normally and has not been ill lately. Drinks 5-6 coffees per day but this is not a new routine for her.   Past Medical History  Diagnosis Date  . Diabetes mellitus without complication ([place])     There are no active problems to display for this patient.   Past Surgical History  Procedure Laterality Date  . Cholecystectomy    . Hernia repair    . Abdominal hysterectomy    . Cesarean section      Current Outpatient Rx  Name  Route  Sig  Dispense  Refill  . aspirin 81 MG tablet   Oral   Take 81 mg by mouth daily.         [name] atorvastatin (LIPITOR) 40 MG tablet   Oral   Take 40 mg by mouth daily.         [name] b complex vitamins tablet   Oral   Take 1 tablet by mouth daily.         . cyclobenzaprine  (FLEXERIL) 10 MG tablet   Oral   Take 1 tablet (10 mg total) by mouth every 8 (eight) hours as needed for muscle spasms.   30 tablet   1   . enalapril (VASOTEC) 2.5 MG tablet   Oral   Take 2.5 mg by mouth daily.         [name] glipiZIDE (GLUCOTROL XL) 10 MG 24 hr tablet   Oral   Take 10 mg by mouth daily with breakfast.         . HYDROcodone-acetaminophen (NORCO) 5-325 MG per tablet   Oral   Take 1-2 tablets by mouth every 4 (four) hours as needed for moderate pain.   15 tablet   0   . ibuprofen (ADVIL,MOTRIN) 800 MG tablet   Oral   Take 1 tablet (800 mg total) by mouth every 8 (eight) hours as needed.   30 tablet   0   . Insulin Detemir (LEVEMIR FLEXPEN) 100 UNIT/ML Pen   Subcutaneous   Inject 50 Units into the skin daily at 10 pm.         . insulin lispro (HUMALOG) 100 UNIT/ML injection   Subcutaneous   Inject 10 Units into the skin once.         [name]  metFORMIN (GLUCOPHAGE-XR) 500 MG 24 hr tablet   Oral   Take 1,500 mg by mouth daily with breakfast.         . predniSONE (DELTASONE) 10 MG tablet   Oral   Take 5 tablets (50 mg total) by mouth daily with breakfast.   50 tablet   0     Allergies Review of patient's allergies indicates no known allergies.  Family History  Problem Relation Age of Onset  . Heart failure Mother   . Heart failure Father     Social History Social History  Substance Use Topics  . Smoking status: Current Every Day Smoker -- 1.00 packs/day  . Smokeless tobacco: Never Used  . Alcohol Use: No    Review of Systems Constitutional: No fever/chills Eyes: No visual changes. ENT: No sore throat. Cardiovascular:As above Respiratory: Denies shortness of breath. Gastrointestinal: No abdominal pain.  No nausea, no vomiting.  No diarrhea.  No constipation. Genitourinary: Negative for dysuria. Musculoskeletal: Negative for back pain. Skin: Negative for rash. Neurological: Negative for headaches, focal weakness or  numbness.  10-point ROS otherwise negative.  ____________________________________________   PHYSICAL EXAM:  VITAL SIGNS: ED Triage Vitals  Enc Vitals Group     BP 04/23/16 1627 140/91 mmHg     Pulse Rate 04/23/16 1627 103     Resp 04/23/16 1627 20     Temp 04/23/16 1627 98.6 F (37 C)     Temp Source 04/23/16 1627 Oral     SpO2 04/23/16 1627 97 %     Weight 04/23/16 1627 165 lb (74.844 kg)     Height 04/23/16 1627 5\' 3"  (1.6 m)     Head Cir --      Peak Flow --      Pain Score 04/23/16 1630 0     Pain Loc --      Pain Edu? --      Excl. in [place]? --     Constitutional: Alert and oriented. Well appearing and in no acute distress. Eyes: Conjunctivae are normal. PERRL. EOMI. Head: Atraumatic. Nose: No congestion/rhinnorhea. Mouth/Throat: Mucous membranes are moist.   Neck: No stridor.   Cardiovascular: Normal rate, regular rhythm. Grossly normal heart sounds.   Respiratory: Normal respiratory effort.  No retractions. Lungs CTAB. Gastrointestinal: Soft and nontender. No distention.  Musculoskeletal: No lower extremity tenderness nor edema.  No joint effusions. Neurologic:  Normal speech and language. No gross focal neurologic deficits are appreciated.  Skin:  Skin is warm, dry and intact. No rash noted. Psychiatric: Mood and affect are normal. Speech and behavior are normal.  ____________________________________________   LABS (all labs ordered are listed, but only abnormal results are displayed)  Labs Reviewed  TROPONIN I  CBC WITH DIFFERENTIAL/PLATELET  BRAIN NATRIURETIC PEPTIDE   ____________________________________________  EKG  ED ECG REPORT I, Schaevitz,  Youlanda Roys, the attending physician, personally viewed and interpreted this ECG.   Date: 04/23/2016  EKG Time: 1634  Rate: 98  Rhythm: normal sinus rhythm  Axis: Normal  Intervals:none  ST&T Change: No ST segment elevation or depression. No abnormal T-wave inversion.  Previous EKGs reviewed from the  urgent care center where the patient was found to be in SVT. EKG read is wide complex tachycardia by the machine. However, it does not appear wide-complex on my read. ____________________________________________  RADIOLOGY   ____________________________________________   PROCEDURES   Procedures    ____________________________________________   INITIAL IMPRESSION / ASSESSMENT AND PLAN / ED COURSE  Pertinent labs & imaging  results that were available during my care of the patient were reviewed by me and considered in my medical decision making (see chart for details).  ----------------------------------------- 4:35 PM on 04/23/2016 ----------------------------------------- Discussed case Dr. Saralyn Pilar of the cardiology service. The patient says that she had been seen by Dr. Ubaldo Glassing several years ago and had a negative stress test. Dr. Saralyn Pilar reviewed the EKGs including those from urgent care. He agrees that the QRS complex was narrow and not wide. He agrees with metoprolol succinate 50 mg daily as an outpatient as long as the labs are appropriate for discharge.  ----------------------------------------- 6:34 PM on 04/23/2016 -----------------------------------------  Patient resting comfortably with very reassuring vital signs. Patient continues to be asymptomatic. Reassuring electrolytes with mildly decreased potassium. We'll replete potassium here in the emergency department. Patient understands the plan for follow-up with cardiology as well as to take her metoprolol. Will be discharged home. Understands the plan and is willing to comply.  ____________________________________________   FINAL CLINICAL IMPRESSION(S) / ED DIAGNOSES  SVT. Hypokalemia.    NEW MEDICATIONS STARTED DURING THIS VISIT:  New Prescriptions   No medications on file     Note:  This document was prepared using Dragon voice recognition software and may include unintentional dictation  errors.    Orbie Pyo, MD 04/23/16 [phone]

## 2016-04-23 NOTE — ED Notes (Signed)
Patient says around 2:30pm she could tell her heartbeat was increasing. She states she checked it at home and it was around 195 bpm. She also complains of some chest discomfort.

## 2016-04-23 NOTE — ED Notes (Signed)
Pt sent to er for eval of rapid heart rate.  Pt was at work when event started.  No chest pain or sob.  Cigsmoker.  Pt was given adensione at urgent care with conversion of heart rate.  Pt in sinus at 103 on arrival to er.  No chest pain or sob. md at bedside.

## 2016-04-23 NOTE — Discharge Instructions (Signed)

## 2016-04-23 NOTE — ED Notes (Signed)
MD at bedside. 

## 2016-05-01 ENCOUNTER — Other Ambulatory Visit: Payer: Self-pay | Admitting: Family Medicine

## 2016-05-01 DIAGNOSIS — Z1231 Encounter for screening mammogram for malignant neoplasm of breast: Secondary | ICD-10-CM

## 2016-05-09 ENCOUNTER — Ambulatory Visit
Admission: RE | Admit: 2016-05-09 | Discharge: 2016-05-09 | Disposition: A | Discharge status: Home / Self Care | Payer: 59 | Source: Ambulatory Visit | Attending: Family Medicine | Admitting: Family Medicine

## 2016-05-09 DIAGNOSIS — Z1231 Encounter for screening mammogram for malignant neoplasm of breast: Secondary | ICD-10-CM | POA: Diagnosis not present

## 2016-05-09 HISTORY — DX: Malignant (primary) neoplasm, unspecified: C80.1

## 2016-06-19 ENCOUNTER — Encounter: Payer: Self-pay | Admitting: Emergency Medicine

## 2016-06-19 ENCOUNTER — Ambulatory Visit (INDEPENDENT_AMBULATORY_CARE_PROVIDER_SITE_OTHER)
Admit: 2016-06-19 | Discharge: 2016-06-19 | Disposition: A | Discharge status: Home / Self Care | Payer: 59 | Attending: Family Medicine | Admitting: Family Medicine

## 2016-06-19 ENCOUNTER — Ambulatory Visit
Admission: EM | Admit: 2016-06-19 | Discharge: 2016-06-19 | Disposition: A | Discharge status: Home / Self Care | Payer: 59 | Attending: Family Medicine | Admitting: Family Medicine

## 2016-06-19 DIAGNOSIS — N200 Calculus of kidney: Secondary | ICD-10-CM | POA: Diagnosis not present

## 2016-06-19 DIAGNOSIS — R1030 Lower abdominal pain, unspecified: Secondary | ICD-10-CM

## 2016-06-19 LAB — CBC WITH DIFFERENTIAL/PLATELET
BASOS ABS: 0.1 10*3/uL (ref 0–0.1)
Basophils Relative: 1 %
EOS PCT: 1 %
Eosinophils Absolute: 0.2 10*3/uL (ref 0–0.7)
HCT: 45.2 % (ref 35.0–47.0)
HEMOGLOBIN: 15.2 g/dL (ref 12.0–16.0)
LYMPHS ABS: 1.6 10*3/uL (ref 1.0–3.6)
LYMPHS PCT: 9 %
MCH: 28.5 pg (ref 26.0–34.0)
MCHC: 33.6 g/dL (ref 32.0–36.0)
MCV: 85 fL (ref 80.0–100.0)
Monocytes Absolute: 0.7 10*3/uL (ref 0.2–0.9)
Monocytes Relative: 4 %
NEUTROS ABS: 14.4 10*3/uL — AB (ref 1.4–6.5)
NEUTROS PCT: 85 %
Platelets: 128 10*3/uL — ABNORMAL LOW (ref 150–440)
RBC: 5.32 MIL/uL — ABNORMAL HIGH (ref 3.80–5.20)
RDW: 13.9 % (ref 11.5–14.5)
WBC: 17 10*3/uL — AB (ref 3.6–11.0)

## 2016-06-19 LAB — LIPASE, BLOOD: Lipase: 35 U/L (ref 11–51)

## 2016-06-19 LAB — AMYLASE: Amylase: 63 U/L (ref 28–100)

## 2016-06-19 LAB — URINALYSIS COMPLETE WITH MICROSCOPIC (ARMC ONLY)
Bilirubin Urine: NEGATIVE
GLUCOSE, UA: NEGATIVE mg/dL
Ketones, ur: 15 mg/dL — AB
Leukocytes, UA: NEGATIVE
NITRITE: NEGATIVE
PROTEIN: 30 mg/dL — AB
SPECIFIC GRAVITY, URINE: 1.025 (ref 1.005–1.030)
pH: 5 (ref 5.0–8.0)

## 2016-06-19 LAB — COMPREHENSIVE METABOLIC PANEL
ALT: 28 U/L (ref 14–54)
ANION GAP: 7 (ref 5–15)
AST: 20 U/L (ref 15–41)
Albumin: 4.6 g/dL (ref 3.5–5.0)
Alkaline Phosphatase: 82 U/L (ref 38–126)
BUN: 19 mg/dL (ref 6–20)
CO2: 24 mmol/L (ref 22–32)
Calcium: 9.6 mg/dL (ref 8.9–10.3)
Chloride: 103 mmol/L (ref 101–111)
Creatinine, Ser: 0.98 mg/dL (ref 0.44–1.00)
GFR calc Af Amer: >60 mL/min (ref >60)
GFR calc non Af Amer: >60 mL/min (ref >60)
GLUCOSE: 256 mg/dL — AB (ref 65–99)
POTASSIUM: 4.3 mmol/L (ref 3.5–5.1)
SODIUM: 134 mmol/L — AB (ref 135–145)
Total Bilirubin: 0.9 mg/dL (ref 0.3–1.2)
Total Protein: 7.8 g/dL (ref 6.5–8.1)

## 2016-06-19 MED ORDER — HYDROCODONE-ACETAMINOPHEN 5-325 MG PO TABS: 1.0000 | ORAL_TABLET | Freq: Three times a day (TID) | ORAL | 0 refills | Status: DC | PRN

## 2016-06-19 MED ORDER — TAMSULOSIN HCL 0.4 MG PO CAPS: 0.4000 mg | ORAL_CAPSULE | Freq: Every day | ORAL | 0 refills | Status: DC

## 2016-06-19 MED ORDER — KETOROLAC TROMETHAMINE 60 MG/2ML IM SOLN
60.0000 mg | Freq: Once | INTRAMUSCULAR | Status: AC
  Administered 2016-06-19: 60 mg via INTRAMUSCULAR

## 2016-06-19 MED ORDER — ONDANSETRON 8 MG PO TBDP: 8.0000 mg | ORAL_TABLET | Freq: Three times a day (TID) | ORAL | 0 refills | Status: DC | PRN

## 2016-06-19 MED ORDER — ONDANSETRON 8 MG PO TBDP
8.0000 mg | ORAL_TABLET | Freq: Once | ORAL | Status: AC
  Administered 2016-06-19: 8 mg via ORAL

## 2016-06-19 NOTE — ED Provider Notes (Signed)
[facility]    CSN: [id number] Arrival date & time: 06/19/16  [id number]  First Provider Contact:  First MD Initiated Contact with Patient 06/19/16 [phone]        History   Chief Complaint Chief Complaint  Patient presents with  . Back Pain    HPI Dawn Farrell is a 60 y.o. female.   Patient's here because of pain in her left flank. Started yesterday it radiates down to her abdomen. She states pain goes all the way down to her left lower quadrant. She states the pain feels is deep and feels this pressure stabbing sensation. She's had a history of kidney stones before she states this is infiltrated typical kidney stone but it is very painful she's describes pain 10 out of 10. She reports having some nausea with this as well. She states she seems circumflex and a urine but she is recently has stopped diabetic medication that causes increased sugar in the urine back she stopped because of the cost but she recently had a UTI complication from that. Past smoker history basal cancer face diabetes mellitus she had ventricular arrhythmia which was seen here last and since the ED because of it she's had a C-section partial hysterectomy she's had kidney stones before hernia repair and she has a cholecystectomy in the past as well. She also states she still smokes. Mother father both had heart failure before the past.   The history is provided by the patient. No language interpreter was used.  Flank Pain  This is a new problem. The current episode started yesterday. The problem occurs constantly. The problem has been gradually worsening. Associated symptoms include abdominal pain. Pertinent negatives include no chest pain, no headaches and no shortness of breath. Nothing relieves the symptoms. Treatments tried: Anti-inflammatory. The treatment provided mild relief.    Past Medical History:  Diagnosis Date  . Cancer ([place])    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  . Diabetes mellitus without complication ([place])       There are no active problems to display for this patient.   Past Surgical History:  Procedure Laterality Date  . ABDOMINAL HYSTERECTOMY    . CESAREAN SECTION    . CHOLECYSTECTOMY    . HERNIA REPAIR      OB History    No data available       Home Medications    Prior to Admission medications   Medication Sig Start Date End Date Taking? Authorizing Provider  aspirin 81 MG tablet Take 81 mg by mouth daily.    Historical Provider, MD  atorvastatin (LIPITOR) 40 MG tablet Take 40 mg by mouth daily.    Historical Provider, MD  b complex vitamins tablet Take 1 tablet by mouth daily.    Historical Provider, MD  enalapril (VASOTEC) 2.5 MG tablet Take 2.5 mg by mouth daily.    Historical Provider, MD  glipiZIDE (GLUCOTROL XL) 10 MG 24 hr tablet Take 10 mg by mouth daily with breakfast.    Historical Provider, MD  HYDROcodone-acetaminophen (NORCO) 5-325 MG tablet Take 1 tablet by mouth every 8 (eight) hours as needed for moderate pain. 06/19/16   Frederich Cha, MD  ibuprofen (ADVIL,MOTRIN) 800 MG tablet Take 1 tablet (800 mg total) by mouth every 8 (eight) hours as needed. 04/16/15   Pierce Crane Beers, PA-C  Insulin Detemir (LEVEMIR FLEXPEN) 100 UNIT/ML Pen Inject 50 Units into the skin daily at 10 pm.    Historical Provider, MD  insulin lispro (HUMALOG) 100  UNIT/ML injection Inject 10 Units into the skin once.    Historical Provider, MD  metFORMIN (GLUCOPHAGE-XR) 500 MG 24 hr tablet Take 1,500 mg by mouth daily with breakfast.    Historical Provider, MD  metoprolol succinate (TOPROL XL) 50 MG 24 hr tablet Take 1 tablet (50 mg total) by mouth daily. Take with or immediately following a meal. 04/23/16   Orbie Pyo, MD  ondansetron (ZOFRAN ODT) 8 MG disintegrating tablet Take 1 tablet (8 mg total) by mouth every 8 (eight) hours as needed for nausea or vomiting. 06/19/16   Frederich Cha, MD  tamsulosin (FLOMAX) 0.4 MG CAPS capsule Take 1 capsule (0.4 mg total) by mouth daily. 06/19/16    Frederich Cha, MD    Family History Family History  Problem Relation Age of Onset  . Heart failure Mother   . Heart failure Father   . Breast cancer Neg Hx     Social History Social History  Substance Use Topics  . Smoking status: Current Every Day Smoker    Packs/day: 1.00  . Smokeless tobacco: Never Used  . Alcohol use No     Allergies   Review of patient's allergies indicates no known allergies.   Review of Systems Review of Systems  Respiratory: Negative for shortness of breath.   Cardiovascular: Negative for chest pain.  Gastrointestinal: Positive for abdominal pain and nausea.  Genitourinary: Positive for flank pain.  Neurological: Negative for headaches.  All other systems reviewed and are negative.    Physical Exam Triage Vital Signs ED Triage Vitals  Enc Vitals Group     BP 06/19/16 0852 (!) 148/71     Pulse Rate 06/19/16 0852 79     Resp 06/19/16 0852 16     Temp 06/19/16 0852 97.8 F (36.6 C)     Temp Source 06/19/16 0852 Tympanic     SpO2 06/19/16 0852 98 %     Weight 06/19/16 0853 170 lb (77.1 kg)     Height 06/19/16 0853 5\' 3"  (1.6 m)     Head Circumference --      Peak Flow --      Pain Score 06/19/16 0854 10     Pain Loc --      Pain Edu? --      Excl. in [place]? --    No data found.   Updated Vital Signs BP 140/70 (BP Location: Left Arm)   Pulse 81   Temp 97.8 F (36.6 C) (Tympanic)   Resp 16   Ht 5\' 3"  (1.6 m)   Wt 170 lb (77.1 kg)   SpO2 100%   BMI 30.11 kg/m   Visual Acuity Right Eye Distance:   Left Eye Distance:   Bilateral Distance:    Right Eye Near:   Left Eye Near:    Bilateral Near:     Physical Exam  Constitutional: She is oriented to person, place, and time. She appears well-developed and well-nourished.  HENT:  Head: Normocephalic.  Eyes: Pupils are equal, round, and reactive to light.  Neck: Normal range of motion.  Cardiovascular: Normal rate, regular rhythm and normal heart sounds.   Pulmonary/Chest:  Effort normal and breath sounds normal. No respiratory distress.  Abdominal: Soft. She exhibits distension. There is no hepatosplenomegaly. There is tenderness. There is CVA tenderness. No hernia.    Musculoskeletal: Normal range of motion.  Neurological: She is alert and oriented to person, place, and time.  Skin: Skin is warm. She is not diaphoretic.  Psychiatric:  She has a normal mood and affect.  Vitals reviewed.    UC Treatments / Results  Labs (all labs ordered are listed, but only abnormal results are displayed) Labs Reviewed  URINALYSIS COMPLETEWITH MICROSCOPIC ([facility] ONLY) - Abnormal; Notable for the following:       Result Value   APPearance HAZY (*)    Ketones, ur 15 (*)    Hgb urine dipstick TRACE (*)    Protein, ur 30 (*)    Bacteria, UA FEW (*)    Squamous Epithelial / LPF 6-30 (*)    All other components within normal limits  CBC WITH DIFFERENTIAL/PLATELET - Abnormal; Notable for the following:    WBC 17.0 (*)    RBC 5.32 (*)    Platelets 128 (*)    Neutro Abs 14.4 (*)    All other components within normal limits  COMPREHENSIVE METABOLIC PANEL - Abnormal; Notable for the following:    Sodium 134 (*)    Glucose, Bld 256 (*)    All other components within normal limits  LIPASE, BLOOD  AMYLASE    EKG  EKG Interpretation None       Radiology Ct Renal Stone Study  Result Date: 06/19/2016 CLINICAL DATA:  Left-sided back and flank pain since yesterday. EXAM: CT ABDOMEN AND PELVIS WITHOUT CONTRAST TECHNIQUE: Multidetector CT imaging of the abdomen and pelvis was performed following the standard protocol without IV contrast. COMPARISON:  CT scan 03/02/2013 FINDINGS: Lower chest: The lung bases are clear of acute process. No pleural effusion or pulmonary lesions. The heart is normal in size. No pericardial effusion. Coronary artery calcifications are noted. The distal esophagus and aorta are unremarkable. Hepatobiliary: The unenhanced appearance of the liver is  unremarkable. No focal hepatic lesions or intrahepatic biliary dilatation. A few small calcified granulomas are noted. The gallbladder surgically absent. No common bile duct dilatation. Pancreas: No mass, inflammation or duct dilatation. Spleen: Normal size.  Scattered calcified granulomas. Adrenals/Urinary Tract: The adrenal glands are normal. Bilateral renal calculi are noted. There is left-sided hydroureteronephrosis down to an obstructing 4 mm calculus in the distal left ureter just proximal to the UVJ. No bladder calculi. No right-sided ureteral calculi. No renal lesions. No bladder lesion. Stomach/Bowel: The stomach, duodenum, small bowel and colon are grossly normal without oral contrast. No inflammatory changes, mass lesions or obstructive findings. The appendix is normal. Vascular/Lymphatic: Age advanced atherosclerotic calcifications involving the aorta and branch vessel ostia. No aneurysm. Small scattered mesenteric and retroperitoneal lymph nodes but no mass or adenopathy. Other: The uterus is surgically absent. Both ovaries are still present and normal. No pelvic mass or adenopathy. No free pelvic fluid collections. No inguinal mass or adenopathy. No abdominal wall hernia or subcutaneous lesions. Musculoskeletal: No significant bony findings. IMPRESSION: 1. 4 mm distal left ureteral calculus causing moderate hydroureteronephrosis. 2. Bilateral renal calculi. 3. Age advanced atherosclerotic calcifications involving the aorta and iliac arteries and branch vessel ostia. Coronary artery calcifications are also noted. 4. Status post cholecystectomy and hysterectomy. Electronically Signed   By: Marijo Sanes M.D.   On: 06/19/2016 10:07    Procedures Procedures (including critical care time)  Medications Ordered in UC Medications  ketorolac (TORADOL) injection 60 mg (60 mg Intramuscular Given 06/19/16 0914)  ondansetron (ZOFRAN-ODT) disintegrating tablet 8 mg (8 mg Oral Given 06/19/16 0917)      Initial Impression / Assessment and Plan / UC Course  I have reviewed the triage vital signs and the nursing notes.  Pertinent labs & imaging results  that were available during my care of the patient were reviewed by me and considered in my medical decision making (see chart for details).    Results for orders placed or performed during the hospital encounter of 06/19/16  Urinalysis complete, with microscopic  Result Value Ref Range   Color, Urine YELLOW YELLOW   APPearance HAZY (A) CLEAR   Glucose, UA NEGATIVE NEGATIVE mg/dL   Bilirubin Urine NEGATIVE NEGATIVE   Ketones, ur 15 (A) NEGATIVE mg/dL   Specific Gravity, Urine 1.025 1.005 - 1.030   Hgb urine dipstick TRACE (A) NEGATIVE   pH 5.0 5.0 - 8.0   Protein, ur 30 (A) NEGATIVE mg/dL   Nitrite NEGATIVE NEGATIVE   Leukocytes, UA NEGATIVE NEGATIVE   RBC / HPF 0-5 0 - 5 RBC/hpf   WBC, UA 0-5 0 - 5 WBC/hpf   Bacteria, UA FEW (A) NONE SEEN   Squamous Epithelial / LPF 6-30 (A) NONE SEEN   Amorphous Crystal PRESENT   CBC with Differential  Result Value Ref Range   WBC 17.0 (H) 3.6 - 11.0 K/uL   RBC 5.32 (H) 3.80 - 5.20 MIL/uL   Hemoglobin 15.2 12.0 - 16.0 g/dL   HCT 45.2 35.0 - 47.0 %   MCV 85.0 80.0 - 100.0 fL   MCH 28.5 26.0 - 34.0 pg   MCHC 33.6 32.0 - 36.0 g/dL   RDW 13.9 11.5 - 14.5 %   Platelets 128 (L) 150 - 440 K/uL   Neutrophils Relative % 85 %   Neutro Abs 14.4 (H) 1.4 - 6.5 K/uL   Lymphocytes Relative 9 %   Lymphs Abs 1.6 1.0 - 3.6 K/uL   Monocytes Relative 4 %   Monocytes Absolute 0.7 0.2 - 0.9 K/uL   Eosinophils Relative 1 %   Eosinophils Absolute 0.2 0 - 0.7 K/uL   Basophils Relative 1 %   Basophils Absolute 0.1 0 - 0.1 K/uL  Comprehensive metabolic panel  Result Value Ref Range   Sodium 134 (L) 135 - 145 mmol/L   Potassium 4.3 3.5 - 5.1 mmol/L   Chloride 103 101 - 111 mmol/L   CO2 24 22 - 32 mmol/L   Glucose, Bld 256 (H) 65 - 99 mg/dL   BUN 19 6 - 20 mg/dL   Creatinine, Ser 0.98 0.44 - 1.00  mg/dL   Calcium 9.6 8.9 - 10.3 mg/dL   Total Protein 7.8 6.5 - 8.1 g/dL   Albumin 4.6 3.5 - 5.0 g/dL   AST 20 15 - 41 U/L   ALT 28 14 - 54 U/L   Alkaline Phosphatase 82 38 - 126 U/L   Total Bilirubin 0.9 0.3 - 1.2 mg/dL   GFR calc non Af Amer >60 >60 mL/min   GFR calc Af Amer >60 >60 mL/min   Anion gap 7 5 - 15  Lipase, blood  Result Value Ref Range   Lipase 35 11 - 51 U/L  Amylase  Result Value Ref Range   Amylase 63 28 - 100 U/L   Clinical Course    Urinalysis is not back yet but we will proceed with her history with renal study and we'll give her 60 Toradol IM will check a CBC, CMP and amylase and lipase..  Final Clinical Impressions(s) / UC Diagnoses   Final diagnoses:  Kidney stone on left side  Lower abdominal pain    New Prescriptions New Prescriptions   HYDROCODONE-ACETAMINOPHEN (NORCO) 5-325 MG TABLET    Take 1 tablet by mouth every 8 (eight)  hours as needed for moderate pain.   ONDANSETRON (ZOFRAN ODT) 8 MG DISINTEGRATING TABLET    Take 1 tablet (8 mg total) by mouth every 8 (eight) hours as needed for nausea or vomiting.   TAMSULOSIN (FLOMAX) 0.4 MG CAPS CAPSULE    Take 1 capsule (0.4 mg total) by mouth daily.     Frederich Cha, MD 06/19/16 1046

## 2016-06-19 NOTE — ED Triage Notes (Signed)
Patient c/o left sided back pain that started yesterday.  Patient reports N/V.

## 2016-06-19 NOTE — ED Notes (Signed)
Prior Authorization for CT scan [phone]

## 2016-06-22 ENCOUNTER — Emergency Department: Payer: Commercial Managed Care - HMO

## 2016-06-22 ENCOUNTER — Encounter: Payer: Self-pay | Admitting: Emergency Medicine

## 2016-06-22 ENCOUNTER — Emergency Department
Admission: EM | Admit: 2016-06-22 | Discharge: 2016-06-22 | Disposition: A | Discharge status: Home / Self Care | Payer: Commercial Managed Care - HMO | Attending: Emergency Medicine | Admitting: Emergency Medicine

## 2016-06-22 DIAGNOSIS — E119 Type 2 diabetes mellitus without complications: Secondary | ICD-10-CM | POA: Diagnosis not present

## 2016-06-22 DIAGNOSIS — F17200 Nicotine dependence, unspecified, uncomplicated: Secondary | ICD-10-CM | POA: Insufficient documentation

## 2016-06-22 DIAGNOSIS — N201 Calculus of ureter: Secondary | ICD-10-CM | POA: Insufficient documentation

## 2016-06-22 DIAGNOSIS — Z794 Long term (current) use of insulin: Secondary | ICD-10-CM | POA: Insufficient documentation

## 2016-06-22 DIAGNOSIS — R109 Unspecified abdominal pain: Secondary | ICD-10-CM

## 2016-06-22 DIAGNOSIS — Z7984 Long term (current) use of oral hypoglycemic drugs: Secondary | ICD-10-CM | POA: Diagnosis not present

## 2016-06-22 DIAGNOSIS — R1032 Left lower quadrant pain: Secondary | ICD-10-CM | POA: Diagnosis present

## 2016-06-22 DIAGNOSIS — Z85828 Personal history of other malignant neoplasm of skin: Secondary | ICD-10-CM | POA: Insufficient documentation

## 2016-06-22 DIAGNOSIS — Z7982 Long term (current) use of aspirin: Secondary | ICD-10-CM | POA: Diagnosis not present

## 2016-06-22 DIAGNOSIS — Z79899 Other long term (current) drug therapy: Secondary | ICD-10-CM | POA: Diagnosis not present

## 2016-06-22 LAB — LIPASE, BLOOD: Lipase: 21 U/L (ref 11–51)

## 2016-06-22 LAB — COMPREHENSIVE METABOLIC PANEL
ALBUMIN: 3.9 g/dL (ref 3.5–5.0)
ALK PHOS: 64 U/L (ref 38–126)
ALT: 42 U/L (ref 14–54)
ANION GAP: 7 (ref 5–15)
AST: 22 U/L (ref 15–41)
BILIRUBIN TOTAL: 0.5 mg/dL (ref 0.3–1.2)
BUN: 12 mg/dL (ref 6–20)
CALCIUM: 8.8 mg/dL — AB (ref 8.9–10.3)
CO2: 25 mmol/L (ref 22–32)
CREATININE: 0.97 mg/dL (ref 0.44–1.00)
Chloride: 104 mmol/L (ref 101–111)
GFR calc Af Amer: >60 mL/min (ref >60)
GFR calc non Af Amer: >60 mL/min (ref >60)
GLUCOSE: 151 mg/dL — AB (ref 65–99)
Potassium: 3.9 mmol/L (ref 3.5–5.1)
Sodium: 136 mmol/L (ref 135–145)
TOTAL PROTEIN: 7.1 g/dL (ref 6.5–8.1)

## 2016-06-22 LAB — URINALYSIS COMPLETE WITH MICROSCOPIC (ARMC ONLY)
Bilirubin Urine: NEGATIVE
Glucose, UA: NEGATIVE mg/dL
NITRITE: NEGATIVE
Protein, ur: NEGATIVE mg/dL
SPECIFIC GRAVITY, URINE: 1.015 (ref 1.005–1.030)
pH: 5 (ref 5.0–8.0)

## 2016-06-22 LAB — CBC
HCT: 37.5 % (ref 35.0–47.0)
HEMOGLOBIN: 13.1 g/dL (ref 12.0–16.0)
MCH: 29.4 pg (ref 26.0–34.0)
MCHC: 35 g/dL (ref 32.0–36.0)
MCV: 83.9 fL (ref 80.0–100.0)
PLATELETS: 105 10*3/uL — AB (ref 150–440)
RBC: 4.46 MIL/uL (ref 3.80–5.20)
RDW: 13.8 % (ref 11.5–14.5)
WBC: 14.5 10*3/uL — ABNORMAL HIGH (ref 3.6–11.0)

## 2016-06-22 MED ORDER — HYDROMORPHONE HCL 2 MG PO TABS: 2.0000 mg | ORAL_TABLET | Freq: Two times a day (BID) | ORAL | 0 refills | Status: AC | PRN

## 2016-06-22 MED ORDER — HYDROMORPHONE HCL 1 MG/ML IJ SOLN
1.0000 mg | Freq: Once | INTRAMUSCULAR | Status: AC
  Administered 2016-06-22: 1 mg via INTRAVENOUS

## 2016-06-22 MED ORDER — HYDROMORPHONE HCL 1 MG/ML IJ SOLN
0.5000 mg | Freq: Once | INTRAMUSCULAR | Status: AC
  Administered 2016-06-22: 0.5 mg via INTRAVENOUS

## 2016-06-22 MED ORDER — ONDANSETRON HCL 4 MG/2ML IJ SOLN
INTRAMUSCULAR | Status: AC
  Administered 2016-06-22: 4 mg via INTRAVENOUS
  Filled 2016-06-22: qty 2

## 2016-06-22 MED ORDER — HYDROMORPHONE HCL 1 MG/ML IJ SOLN
INTRAMUSCULAR | Status: AC
  Administered 2016-06-22: 1 mg via INTRAVENOUS
  Filled 2016-06-22: qty 1

## 2016-06-22 MED ORDER — HYDROMORPHONE HCL 1 MG/ML IJ SOLN
INTRAMUSCULAR | Status: AC
  Administered 2016-06-22: 0.5 mg via INTRAVENOUS
  Filled 2016-06-22: qty 1

## 2016-06-22 MED ORDER — ONDANSETRON HCL 4 MG/2ML IJ SOLN
4.0000 mg | Freq: Once | INTRAMUSCULAR | Status: AC
  Administered 2016-06-22: 4 mg via INTRAVENOUS

## 2016-06-22 MED ORDER — SODIUM CHLORIDE 0.9 % IV BOLUS (SEPSIS)
1000.0000 mL | Freq: Once | INTRAVENOUS | Status: AC
  Administered 2016-06-22: 1000 mL via INTRAVENOUS

## 2016-06-22 NOTE — ED Provider Notes (Signed)
Labs Reviewed  COMPREHENSIVE METABOLIC PANEL - Abnormal; Notable for the following:       Result Value   Glucose, Bld 151 (*)    Calcium 8.8 (*)    All other components within normal limits  CBC - Abnormal; Notable for the following:    WBC 14.5 (*)    Platelets 105 (*)    All other components within normal limits  URINALYSIS COMPLETEWITH MICROSCOPIC ([facility] ONLY) - Abnormal; Notable for the following:    Color, Urine YELLOW (*)    APPearance CLEAR (*)    Ketones, ur TRACE (*)    Hgb urine dipstick 2+ (*)    Leukocytes, UA TRACE (*)    Bacteria, UA RARE (*)    Squamous Epithelial / LPF 0-5 (*)    All other components within normal limits  LIPASE, BLOOD   IMPRESSION: The distal left ureteral stone seen previously has migrated 1-2 cm into the left UVJ on the current study. Secondary changes around the left kidney appear slightly progressed in the interval although the degree of hydroureteronephrosis is stable.  Bilateral nephrolithiasis.  Patient has had improvement in her symptoms here with narcotic pain medicine. I have advised her that the stone is progressing, but she will likely be able to pass the stone. She was given additional pain medicine to take at home and referred to urology for follow-up.   Earleen Newport, MD 06/22/16 [phone]

## 2016-06-22 NOTE — ED Notes (Signed)
Pt resting in bed, resp even and unlabored, pt states pain relief, resting comfortable

## 2016-06-22 NOTE — ED Triage Notes (Signed)
Per [facility]: Pt from home presenting with LLQ ABD pain since Monday, accompanied by nausea. Pt. Has known 7mm kidney stone at this time. Was seen at [facility] acute care for pain management, pt. Report oxycodone no relief.

## 2016-06-22 NOTE — ED Provider Notes (Signed)
[facility] Emergency Department Provider Note  __________________________________________   None    (approximate)  I have reviewed the triage vital signs and the nursing notes.   HISTORY  Chief Complaint Abdominal Pain (LLQ) and Nausea  HPI Dawn Farrell is a 60 y.o. female has been having excruciating left flank pain off and on since this past Monday. Patient has been to urgent cares and had a CT scan done on Tuesday which she said showed a 4 mm kidney stone with some mild hydronephrosis as well as she had an ultrasound yesterday. Patient states that the pain just will not get any better and has been severe and she cannot stand it. Patient is back again this morning because she is just with severe pain radiating from left mid quadrant or left groin. Patient states that she plans some blood in her urine this morning that look like a clot. Patient states the pain on scale of 0-10 is a 10. She denies any fever, chills, dysuria, or frequency. She denies any vomiting or diarrhea or change in her bowels.   Past Medical History:  Diagnosis Date  . Cancer ([place])    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  . Diabetes mellitus without complication ([place])     There are no active problems to display for this patient.   Past Surgical History:  Procedure Laterality Date  . ABDOMINAL HYSTERECTOMY    . CESAREAN SECTION    . CHOLECYSTECTOMY    . HERNIA REPAIR      Prior to Admission medications   Medication Sig Start Date End Date Taking? Authorizing Provider  aspirin 81 MG tablet Take 81 mg by mouth daily.    Historical Provider, MD  atorvastatin (LIPITOR) 40 MG tablet Take 40 mg by mouth daily.    Historical Provider, MD  b complex vitamins tablet Take 1 tablet by mouth daily.    Historical Provider, MD  enalapril (VASOTEC) 2.5 MG tablet Take 2.5 mg by mouth daily.    Historical Provider, MD  glipiZIDE (GLUCOTROL XL) 10 MG 24 hr tablet Take 10 mg by mouth daily with breakfast.     Historical Provider, MD  HYDROcodone-acetaminophen (NORCO) 5-325 MG tablet Take 1 tablet by mouth every 8 (eight) hours as needed for moderate pain. 06/19/16   Frederich Cha, MD  ibuprofen (ADVIL,MOTRIN) 800 MG tablet Take 1 tablet (800 mg total) by mouth every 8 (eight) hours as needed. 04/16/15   Pierce Crane Beers, PA-C  Insulin Detemir (LEVEMIR FLEXPEN) 100 UNIT/ML Pen Inject 50 Units into the skin daily at 10 pm.    Historical Provider, MD  insulin lispro (HUMALOG) 100 UNIT/ML injection Inject 10 Units into the skin once.    Historical Provider, MD  metFORMIN (GLUCOPHAGE-XR) 500 MG 24 hr tablet Take 1,500 mg by mouth daily with breakfast.    Historical Provider, MD  metoprolol succinate (TOPROL XL) 50 MG 24 hr tablet Take 1 tablet (50 mg total) by mouth daily. Take with or immediately following a meal. 04/23/16   Orbie Pyo, MD  ondansetron (ZOFRAN ODT) 8 MG disintegrating tablet Take 1 tablet (8 mg total) by mouth every 8 (eight) hours as needed for nausea or vomiting. 06/19/16   Frederich Cha, MD  tamsulosin (FLOMAX) 0.4 MG CAPS capsule Take 1 capsule (0.4 mg total) by mouth daily. 06/19/16   Frederich Cha, MD    Allergies Review of patient's allergies indicates no known allergies.  Family History  Problem Relation Age of Onset  .  Heart failure Mother   . Heart failure Father   . Breast cancer Neg Hx     Social History Social History  Substance Use Topics  . Smoking status: Current Every Day Smoker    Packs/day: 1.00  . Smokeless tobacco: Never Used  . Alcohol use No    Review of Systems Constitutional: No fever/chills Eyes: No visual changes. ENT: No sore throat. Cardiovascular: Denies chest pain. Respiratory: Denies shortness of breath. Gastrointestinal: Severe left flank pain radiating into the left lower quadrant.  No nausea, no vomiting.  No diarrhea.  No constipation. Genitourinary: Negative for dysuria. Positive for hematuria Musculoskeletal: Negative for back  pain. Skin: Negative for rash. Neurological: Negative for headaches, focal weakness or numbness.  10-point ROS otherwise negative.  ____________________________________________   PHYSICAL EXAM:  VITAL SIGNS: ED Triage Vitals  Enc Vitals Group     BP --      Pulse Rate 06/22/16 0619 85     Resp 06/22/16 0619 (!) 22     Temp 06/22/16 0619 98.3 F (36.8 C)     Temp Source 06/22/16 0619 Oral     SpO2 06/22/16 0619 98 %     Weight 06/22/16 0620 170 lb (77.1 kg)     Height 06/22/16 0620 5\' 3"  (1.6 m)     Head Circumference --      Peak Flow --      Pain Score 06/22/16 0620 10     Pain Loc --      Pain Edu? --      Excl. in [place]? --     Constitutional: Alert and oriented. Well appearing and in Moderate distress secondary to her pain Eyes: Conjunctivae are normal. PERRL. EOMI. Head: Atraumatic. Nose: No congestion/rhinnorhea. Mouth/Throat: Mucous membranes are moist.  Oropharynx non-erythematous. Neck: No stridor.   Cardiovascular: Normal rate, regular rhythm. Grossly normal heart sounds.  Good peripheral circulation. Respiratory: Normal respiratory effort.  No retractions. Lungs CTAB. Gastrointestinal: Soft and tender to palpation along her left anterior lateral flank. No distention. No abdominal bruits. No CVA tenderness. GU was positive for hematuria Musculoskeletal: No lower extremity tenderness nor edema.  No joint effusions. Neurologic:  Normal speech and language. No gross focal neurologic deficits are appreciated. No gait instability. Skin:  Skin is warm, dry and intact. No rash noted. Psychiatric: Mood and affect are normal. Speech and behavior are normal.  ____________________________________________   LABS (all labs ordered are listed, but only abnormal results are displayed)  Labs Reviewed  COMPREHENSIVE METABOLIC PANEL - Abnormal; Notable for the following:       Result Value   Glucose, Bld 151 (*)    Calcium 8.8 (*)    All other components within normal  limits  CBC - Abnormal; Notable for the following:    WBC 14.5 (*)    Platelets 105 (*)    All other components within normal limits  LIPASE, BLOOD  URINALYSIS COMPLETEWITH MICROSCOPIC ([facility] ONLY)   ____________________________________________  EKG   ____________________________________________  RADIOLOGY Pending  ____________________________________________   PROCEDURES  Procedure(s) performed: none Procedures  Critical Care performed: No  ____________________________________________   INITIAL IMPRESSION / ASSESSMENT AND PLAN / ED COURSE  Pertinent labs & imaging results that were available during my care of the patient were reviewed by me and considered in my medical decision making (see chart for details).  6:55 AM Patient was in severe pain and therefore we gave her some IV Dilaudid and Zofran. Patient was also given a liter of  IV fluids. We will repeat her CT for stone study to evaluate the kidney stone and where it is added this point along with how much hydronephrosis may be associated. Patient will be signed out to Dr. Jimmye Norman at 8 AM.  Clinical Course     ____________________________________________   FINAL CLINICAL IMPRESSION(S) / ED DIAGNOSES  Final diagnoses:  Acute abdominal pain  Ureteral stone      NEW MEDICATIONS STARTED DURING THIS VISIT:  New Prescriptions   No medications on file     Note:  This document was prepared using Dragon voice recognition software and may include unintentional dictation errors.    Ruby Cola, MD 06/22/16 [phone]

## 2017-01-09 ENCOUNTER — Inpatient Hospital Stay: Payer: BLUE CROSS/BLUE SHIELD

## 2017-01-09 ENCOUNTER — Ambulatory Visit
Admission: RE | Admit: 2017-01-09 | Discharge: 2017-01-09 | Disposition: A | Discharge status: Home / Self Care | Payer: BLUE CROSS/BLUE SHIELD | Source: Ambulatory Visit | Attending: Hematology and Oncology | Admitting: Hematology and Oncology

## 2017-01-09 ENCOUNTER — Encounter: Payer: Self-pay | Admitting: Hematology and Oncology

## 2017-01-09 ENCOUNTER — Inpatient Hospital Stay: Payer: BLUE CROSS/BLUE SHIELD | Attending: Hematology and Oncology | Admitting: Hematology and Oncology

## 2017-01-09 VITALS — BP 114/76 | HR 82 | Temp 97.6°F | Resp 18 | Ht 63.5 in | Wt 171.5 lb

## 2017-01-09 DIAGNOSIS — M25532 Pain in left wrist: Secondary | ICD-10-CM | POA: Insufficient documentation

## 2017-01-09 DIAGNOSIS — Z733 Stress, not elsewhere classified: Secondary | ICD-10-CM | POA: Diagnosis not present

## 2017-01-09 DIAGNOSIS — G4700 Insomnia, unspecified: Secondary | ICD-10-CM | POA: Diagnosis not present

## 2017-01-09 DIAGNOSIS — Z85828 Personal history of other malignant neoplasm of skin: Secondary | ICD-10-CM | POA: Insufficient documentation

## 2017-01-09 DIAGNOSIS — E079 Disorder of thyroid, unspecified: Secondary | ICD-10-CM | POA: Diagnosis not present

## 2017-01-09 DIAGNOSIS — Z79899 Other long term (current) drug therapy: Secondary | ICD-10-CM | POA: Diagnosis not present

## 2017-01-09 DIAGNOSIS — M85632 Other cyst of bone, left forearm: Secondary | ICD-10-CM | POA: Insufficient documentation

## 2017-01-09 DIAGNOSIS — F17210 Nicotine dependence, cigarettes, uncomplicated: Secondary | ICD-10-CM | POA: Diagnosis not present

## 2017-01-09 DIAGNOSIS — Z77090 Contact with and (suspected) exposure to asbestos: Secondary | ICD-10-CM | POA: Insufficient documentation

## 2017-01-09 DIAGNOSIS — D696 Thrombocytopenia, unspecified: Secondary | ICD-10-CM | POA: Insufficient documentation

## 2017-01-09 DIAGNOSIS — Z7982 Long term (current) use of aspirin: Secondary | ICD-10-CM | POA: Insufficient documentation

## 2017-01-09 DIAGNOSIS — D72829 Elevated white blood cell count, unspecified: Secondary | ICD-10-CM | POA: Insufficient documentation

## 2017-01-09 DIAGNOSIS — E1140 Type 2 diabetes mellitus with diabetic neuropathy, unspecified: Secondary | ICD-10-CM | POA: Insufficient documentation

## 2017-01-09 DIAGNOSIS — Z794 Long term (current) use of insulin: Secondary | ICD-10-CM | POA: Diagnosis not present

## 2017-01-09 DIAGNOSIS — R1030 Lower abdominal pain, unspecified: Secondary | ICD-10-CM | POA: Insufficient documentation

## 2017-01-09 DIAGNOSIS — E119 Type 2 diabetes mellitus without complications: Secondary | ICD-10-CM | POA: Diagnosis not present

## 2017-01-09 DIAGNOSIS — Z8614 Personal history of Methicillin resistant Staphylococcus aureus infection: Secondary | ICD-10-CM | POA: Diagnosis not present

## 2017-01-09 DIAGNOSIS — R5383 Other fatigue: Secondary | ICD-10-CM

## 2017-01-09 DIAGNOSIS — L309 Dermatitis, unspecified: Secondary | ICD-10-CM

## 2017-01-09 DIAGNOSIS — J449 Chronic obstructive pulmonary disease, unspecified: Secondary | ICD-10-CM | POA: Insufficient documentation

## 2017-01-09 LAB — CBC WITH DIFFERENTIAL/PLATELET
Basophils Absolute: 0.1 10*3/uL (ref 0–0.1)
Basophils Relative: 1 %
Eosinophils Absolute: 0.4 10*3/uL (ref 0–0.7)
Eosinophils Relative: 4 %
HCT: 43 % (ref 35.0–47.0)
Hemoglobin: 14.4 g/dL (ref 12.0–16.0)
Lymphocytes Relative: 23 %
Lymphs Abs: 2.2 10*3/uL (ref 1.0–3.6)
MCH: 28.4 pg (ref 26.0–34.0)
MCHC: 33.4 g/dL (ref 32.0–36.0)
MCV: 84.8 fL (ref 80.0–100.0)
Monocytes Absolute: 0.4 10*3/uL (ref 0.2–0.9)
Monocytes Relative: 5 %
Neutro Abs: 6.4 10*3/uL (ref 1.4–6.5)
Neutrophils Relative %: 67 %
Platelets: 132 10*3/uL — ABNORMAL LOW (ref 150–440)
RBC: 5.07 MIL/uL (ref 3.80–5.20)
RDW: 13.9 % (ref 11.5–14.5)
WBC: 9.6 10*3/uL (ref 3.6–11.0)

## 2017-01-09 LAB — SEDIMENTATION RATE: Sed Rate: 5 mm/hr (ref 0–30)

## 2017-01-09 LAB — PATHOLOGIST SMEAR REVIEW

## 2017-01-09 NOTE — Progress Notes (Signed)
Dawn Farrell:  01/09/2017  Chief Complaint: Dawn Farrell is a 60 y.o. female with leukocytosis who is referred is referred by Dr. [name] for assessment and management.  HPI:  The patient notes a distant history of thrombocytopenia.  She was seen in 02/2008 and in 05/2008 by Dr. Delight Hoh.  She had mild thrombocytopenia (132,000-142,000).  The etiology was unclear.  SPEP and UPEP were normal.  WBC ranged from 7600 - 8100.  Abdominal ultrasound on 08/10/209 revealed splenic calcifications secondary to granulomatous disease, a probable gallbladder polyp, and heterogenicity of the pancreas..  As her platelet count was normal without increased bruising or bleeding, no additional testing or intervention was performed.  She denies any oral or injected steroids.  The patient states that her WBC count has been elevated on 2 occasions.  She states that every 3 months, she sees Dr Lavone Orn, endocrinologist.  She notes a history of MRSA infection.  She has flares of eczema.  She has had a yeast infection.  CBC on 12/28/2016 revealed a hematocrit of 43, hemoglobin 14.4, platelets 146,000, white count 10,900 and ANC 7600.   Differential was unremarkable.  MPV was 12.3 (7.3-11.7).  Creatinine was 0.8. CBC on 07/27/2016 included a hematocrit of 40.4, hemoglobin 14, platelets 143,000, white count 10,500 and ANC of 7900. Differential was unremarkable.   Symptomatically, she feels good.  She feels exhausted at times secondary to work, stress, and poor sleep.  She describes numbness and tinging associated with her diabetes.    She has numbness in her hands at night.  She has left wrist pain.  Her hands hurt.  She also notes an achiness in her lower abdomen/ovaries for "awhile".   Past Medical History:  Diagnosis Date  . Cancer ([place])    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  . COPD (chronic obstructive pulmonary disease) ([place])   . Diabetes mellitus without  complication ([place])   . Renal calculi     Past Surgical History:  Procedure Laterality Date  . ABDOMINAL HYSTERECTOMY    . CESAREAN SECTION    . CHOLECYSTECTOMY    . HERNIA REPAIR    . Oral surgery - all teeth removed      Family History  Problem Relation Age of Onset  . Heart failure Mother   . Heart failure Father   . Breast cancer Neg Hx     Social History:  reports that she has been smoking.  She has been smoking about 1.00 pack per Farrell. She has never used smokeless tobacco. She reports that she does not drink alcohol or use drugs.  She previously smoked 1-2 packs per Farrell x 30 years.  She stopped smoking 6 months ago.  She is originally from [place].  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  She moved to [place] in 1998.  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  She lives in [place].  She is alone today.  Allergies:  Allergies  Allergen Reactions  . Latex Itching    Current Medications: Current Outpatient Prescriptions  Medication Sig Dispense Refill  . aspirin EC 81 MG tablet Take 81 mg by mouth at bedtime.    [name] atorvastatin (LIPITOR) 40 MG tablet Take 40 mg by mouth at bedtime.     . enalapril (VASOTEC) 2.5 MG tablet Take 2.5 mg by mouth at bedtime.     . gabapentin (NEURONTIN) 300 MG capsule  Take 300 mg by mouth at bedtime.  2  . glipiZIDE (GLUCOTROL XL) 5 MG 24 hr tablet Take 10 mg by mouth at bedtime.    . Insulin Detemir (LEVEMIR FLEXPEN) 100 UNIT/ML Pen Inject 50 Units into the skin at bedtime.     [name] JARDIANCE 25 MG TABS tablet 25 mg.  4  . Magnesium Oxide 200 MG TABS Take 200 mg by mouth 2 (two) times daily.    . metFORMIN (GLUCOPHAGE-XR) 500 MG 24 hr tablet Take 1,500 mg by mouth every evening.     . metoprolol succinate (TOPROL-XL) 25 MG 24 hr tablet Take 25 mg by mouth at bedtime.    [name] HYDROcodone-acetaminophen (NORCO) 5-325 MG tablet Take 1 tablet by mouth every 8 (eight) hours as needed for moderate pain. (Patient not taking:  Reported on 06/22/2016) 20 tablet 0  . HYDROmorphone (DILAUDID) 2 MG tablet Take 1 tablet (2 mg total) by mouth every 12 (twelve) hours as needed for severe pain. (Patient not taking: Reported on 01/09/2017) 20 tablet 0  . ibuprofen (ADVIL,MOTRIN) 800 MG tablet Take 1 tablet (800 mg total) by mouth every 8 (eight) hours as needed. (Patient not taking: Reported on 01/09/2017) 30 tablet 0  . ondansetron (ZOFRAN ODT) 8 MG disintegrating tablet Take 1 tablet (8 mg total) by mouth every 8 (eight) hours as needed for nausea or vomiting. 15 tablet 0  . oxyCODONE-acetaminophen (PERCOCET/ROXICET) 5-325 MG tablet Take 1 tablet by mouth every 4 (four) hours as needed for severe pain.     . tamsulosin (FLOMAX) 0.4 MG CAPS capsule Take 1 capsule (0.4 mg total) by mouth daily. (Patient not taking: Reported on 01/09/2017) 30 capsule 0   No current facility-administered medications for this visit.     Review of Systems:  GENERAL:  Feels good.  Exhaustion secondary to poor sleep, work, and stress.  No fevers, sweats or weight loss. PERFORMANCE STATUS (ECOG):  0 HEENT:  No visual changes, runny nose, sore throat, mouth sores or tenderness. Lungs: No shortness of breath or cough.  No hemoptysis. Cardiac:  No chest pain, palpitations, orthopnea, or PND. GI:  No nausea, vomiting, diarrhea, constipation, melena or hematochezia.  Need colonoscopy secondary to h/o polyps. GU:  No urgency, frequency, dysuria, or hematuria.  Achiness in ovaries.   Musculoskeletal:  No back pain.  Left wrist pain.  Hands hurt.  No muscle tenderness. Extremities:  No pain or swelling. Skin:  Eczema.  No rashes or skin changes. Neuro:  No headache, numbness or weakness, balance or coordination issues. Endocrine:  No diabetes.  Thyroid nodule.  No hot flashes or night sweats. Psych:  Stress.  No mood changes, depression or anxiety.  Poor sleep. Pain:  No focal pain. Review of systems:  All other systems reviewed and found to be  negative.  Physical Exam: Blood pressure 114/76, pulse 82, temperature 97.6 F (36.4 C), temperature source Tympanic, resp. rate 18, height 5' 3.5" (1.613 m), weight 171 lb 8.3 oz (77.8 kg). GENERAL:  Well developed, well nourished, woman sitting comfortably in the exam room in no acute distress. MENTAL STATUS:  Alert and oriented to person, place and time. HEAD:  Short strawberry blonde hair.  Normocephalic, atraumatic, face symmetric, no Cushingoid features. EYES:  Glasses.  Blue eyes.  Pupils equal round and reactive to light and accomodation.  No conjunctivitis or scleral icterus. ENT:  Hoarse.  Oropharynx clear without lesion.  Tongue normal. Mucous membranes moist.  RESPIRATORY:  Clear to auscultation without rales, wheezes  or rhonchi. CARDIOVASCULAR:  Regular rate and rhythm without murmur, rub or gallop. ABDOMEN:  Soft, slightly tender left lower quadrant without guarding or rebound tenderness. Active bowel sounds and no hepatosplenomegaly.  No masses. SKIN:  No rashes, ulcers or lesions. EXTREMITIES:  Left wrist pain on palpation.  No edema, no skin discoloration or tenderness.  No palpable cords. LYMPH NODES: No palpable cervical, supraclavicular, axillary or inguinal adenopathy  NEUROLOGICAL: Unremarkable. PSYCH:  Appropriate.   Office Visit on 01/09/2017  Component Date Value Ref Range Status  . WBC 01/09/2017 9.6  3.6 - 11.0 K/uL Final  . RBC 01/09/2017 5.07  3.80 - 5.20 MIL/uL Final  . Hemoglobin 01/09/2017 14.4  12.0 - 16.0 g/dL Final  . HCT 01/09/2017 43.0  35.0 - 47.0 % Final  . MCV 01/09/2017 84.8  80.0 - 100.0 fL Final  . MCH 01/09/2017 28.4  26.0 - 34.0 pg Final  . MCHC 01/09/2017 33.4  32.0 - 36.0 g/dL Final  . RDW 01/09/2017 13.9  11.5 - 14.5 % Final  . Platelets 01/09/2017 132* 150 - 440 K/uL Final  . Neutrophils Relative % 01/09/2017 67  % Final  . Neutro Abs 01/09/2017 6.4  1.4 - 6.5 K/uL Final  . Lymphocytes Relative 01/09/2017 23  % Final  . Lymphs Abs  01/09/2017 2.2  1.0 - 3.6 K/uL Final  . Monocytes Relative 01/09/2017 5  % Final  . Monocytes Absolute 01/09/2017 0.4  0.2 - 0.9 K/uL Final  . Eosinophils Relative 01/09/2017 4  % Final  . Eosinophils Absolute 01/09/2017 0.4  0 - 0.7 K/uL Final  . Basophils Relative 01/09/2017 1  % Final  . Basophils Absolute 01/09/2017 0.1  0 - 0.1 K/uL Final  . Path Review 01/09/2017 Blood smear reviewed. There is very mild thrombocytopenia. RBCs and leukocytes are unremarkable. No abnormal or immature cells are seen.    Final  . Sed Rate 01/09/2017 5  0 - 30 mm/hr Final  . Anit Nuclear Antibody(ANA) 01/09/2017 Negative  Negative Final   Comment: (NOTE) Performed At: [facility] [place], [place] [id number] Lindon Romp MD NU:[id number]   . Rhuematoid fact SerPl-aCnc 01/09/2017 14.5* 0.0 - 13.9 IU/mL Final   Comment: (NOTE) Performed At: [facility] [place], [place] [id number] Lindon Romp MD LO:[id number]     Assessment:  ZAYLEY ARRAS is a 61 y.o. female with probable mild reactive leukocytosis.  WBC has ranged between 10,500 - 10,900.  Differential is unremarkable with predominant neutrophils.  She appears to have arthritis.  She has a history of MRSA infection.  She has had flares of eczema.  She has had a yeast infection.  She denies any oral or injected steroids.  She has a distant history of mild thrombocytopenia (02/2008 and 05/2008).  Platelet count ranged from 132,000-142,000.  The etiology was unclear.  SPEP and UPEP were normal.  WBC ranged from 7600 - 8100.  Abdominal ultrasound on 08/10/209 revealed splenic calcifications secondary to granulomatous disease, a probable gallbladder polyp, and heterogenicity of the pancreas..  As her platelet count was normal without increased bruising or bleeding, no additional testing or intervention was performed.  Symptomatically, she feels good.  She feels exhausted secondary to work, stress, and poor  sleep.  She has left wrist pain.  Her hands hurt.  She also notes an achiness in her lower abdomen/ovaries for "awhile".  Plan: 1.  Discuss blood counts.  She has had mild thrombocytopenia.  She has  mild leukocytosis which appears reactive.  Doubt a lymphoproliferative disorder.  Discuss a few tests and review of peripheral smear.  Obtain copy of old records from [facility] (2009). 2.  Labs today:  CBC with diff, ESR, ANA with reflex, rheumatoid factor. 3.  Peripheral smear for pathologic review. 4.  Plain films of left wrist. 5.  Consider imaging of abdomen if symptoms persist. 6.  RTC in 1 week for review of testing.   Lequita Asal, MD  01/09/2017

## 2017-01-09 NOTE — Progress Notes (Signed)
Patient here today as new evaluation regarding leukocytosis.  Referred by Dr. [name].

## 2017-01-10 LAB — ANA W/REFLEX IF POSITIVE: Anti Nuclear Antibody(ANA): NEGATIVE

## 2017-01-10 LAB — RHEUMATOID FACTOR: Rhuematoid fact SerPl-aCnc: 14.5 IU/mL — ABNORMAL HIGH (ref 0.0–13.9)

## 2017-01-16 ENCOUNTER — Ambulatory Visit: Payer: BLUE CROSS/BLUE SHIELD | Admitting: Hematology and Oncology

## 2017-01-16 ENCOUNTER — Inpatient Hospital Stay (HOSPITAL_BASED_OUTPATIENT_CLINIC_OR_DEPARTMENT_OTHER): Payer: BLUE CROSS/BLUE SHIELD | Admitting: Hematology and Oncology

## 2017-01-16 VITALS — BP 117/76 | HR 105 | Temp 97.7°F | Resp 18 | Wt 170.9 lb

## 2017-01-16 DIAGNOSIS — Z794 Long term (current) use of insulin: Secondary | ICD-10-CM

## 2017-01-16 DIAGNOSIS — D696 Thrombocytopenia, unspecified: Secondary | ICD-10-CM

## 2017-01-16 DIAGNOSIS — Z79899 Other long term (current) drug therapy: Secondary | ICD-10-CM

## 2017-01-16 DIAGNOSIS — G4700 Insomnia, unspecified: Secondary | ICD-10-CM

## 2017-01-16 DIAGNOSIS — J449 Chronic obstructive pulmonary disease, unspecified: Secondary | ICD-10-CM

## 2017-01-16 DIAGNOSIS — R1030 Lower abdominal pain, unspecified: Secondary | ICD-10-CM

## 2017-01-16 DIAGNOSIS — R5383 Other fatigue: Secondary | ICD-10-CM

## 2017-01-16 DIAGNOSIS — E119 Type 2 diabetes mellitus without complications: Secondary | ICD-10-CM | POA: Diagnosis not present

## 2017-01-16 DIAGNOSIS — D72829 Elevated white blood cell count, unspecified: Secondary | ICD-10-CM

## 2017-01-16 DIAGNOSIS — Z77090 Contact with and (suspected) exposure to asbestos: Secondary | ICD-10-CM

## 2017-01-16 DIAGNOSIS — E079 Disorder of thyroid, unspecified: Secondary | ICD-10-CM | POA: Diagnosis not present

## 2017-01-16 DIAGNOSIS — D72823 Leukemoid reaction: Secondary | ICD-10-CM

## 2017-01-16 DIAGNOSIS — Z733 Stress, not elsewhere classified: Secondary | ICD-10-CM

## 2017-01-16 DIAGNOSIS — F17210 Nicotine dependence, cigarettes, uncomplicated: Secondary | ICD-10-CM

## 2017-01-16 DIAGNOSIS — R7689 Other specified abnormal immunological findings in serum: Secondary | ICD-10-CM

## 2017-01-16 DIAGNOSIS — Z7982 Long term (current) use of aspirin: Secondary | ICD-10-CM

## 2017-01-16 DIAGNOSIS — R768 Other specified abnormal immunological findings in serum: Secondary | ICD-10-CM

## 2017-01-16 DIAGNOSIS — E1140 Type 2 diabetes mellitus with diabetic neuropathy, unspecified: Secondary | ICD-10-CM

## 2017-01-16 DIAGNOSIS — L309 Dermatitis, unspecified: Secondary | ICD-10-CM | POA: Diagnosis not present

## 2017-01-16 DIAGNOSIS — M25532 Pain in left wrist: Secondary | ICD-10-CM | POA: Diagnosis not present

## 2017-01-16 NOTE — Progress Notes (Signed)
Patient offers no complaints today. Patient here for follow up regarding lab results.

## 2017-01-16 NOTE — Progress Notes (Signed)
Dawn Farrell Clinic day:  01/16/2017  Chief Complaint: Dawn Farrell is a 61 y.o. female with leukocytosis who is seen for review of work-up and discussion regarding direction of therapy.  HPI:  The patient was last seen in the hematology clinic on 01/09/2017.  At that time, she was seen for initial consultation.  She had a mild leukocytosis.  Automated differential was predominanlty neutrophils.  She had chronic mild thrombocytopenia with a negative work-up in 2009.  She has hand pain and left wrist pain suggestive of arthritis.  She also noted an achiness in her lower abdomen/ovaries for "awhile".  She underwent a work-up.  Labs revealed a hematocrit of 43, hemoglobin 14.4, MCV 84.8, platelets 132,000, white count 9600 with an ANC of 6400. Differential was unremarkable. Peripheral smear revealed very mild thrombocytopenia, normal red cells and leukocytes, and no immature cells. ANA was negative.  Rheumatoid factor was 14.5 (0-13.9).  Sed rate was 5.    Left wrist films revealed question old trauma versus arthropathic related calcification adjacent to the ulnar styloid.  There was no acute fracture or dislocation.  There was no appreciable joint space narrowing or erosion.  There was benign appearing cystic area in the scaphoid bone.  There was benign appearing cystic area also noted in the medial hamate bone.  Symptomatically, she notes that she can't make a fist secondary to pain in her hands.  She notes cervical disks are "bone on bone".   Past Medical History:  Diagnosis Date  . Cancer ([place])    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  . COPD (chronic obstructive pulmonary disease) ([place])   . Diabetes mellitus without complication ([place])   . Renal calculi     Past Surgical History:  Procedure Laterality Date  . ABDOMINAL HYSTERECTOMY    . CESAREAN SECTION    . CHOLECYSTECTOMY    . HERNIA REPAIR    . Oral surgery - all teeth removed      Family History  Problem  Relation Age of Onset  . Heart failure Mother   . Heart failure Father   . Breast cancer Neg Hx     Social History:  reports that she has been smoking.  She has been smoking about 1.00 pack per day. She has never used smokeless tobacco. She reports that she does not drink alcohol or use drugs.  She previously smoked 1-2 packs per day x 30 years.  She stopped smoking 6 months ago.  She is originally from [place].  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  She moved to [place] in 1998.  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  The patient is alone today.  Allergies:  Allergies  Allergen Reactions  . Latex Itching    Current Medications: Current Outpatient Prescriptions  Medication Sig Dispense Refill  . aspirin EC 81 MG tablet Take 81 mg by mouth at bedtime.    [name] atorvastatin (LIPITOR) 40 MG tablet Take 40 mg by mouth at bedtime.     . enalapril (VASOTEC) 2.5 MG tablet Take 2.5 mg by mouth at bedtime.     . gabapentin (NEURONTIN) 300 MG capsule Take 300 mg by mouth at bedtime.  2  . glipiZIDE (GLUCOTROL XL) 5 MG 24 hr tablet Take 10 mg by mouth at bedtime.    . Insulin Detemir (LEVEMIR FLEXPEN) 100 UNIT/ML Pen Inject 50 Units into the skin at bedtime.     [name]  JARDIANCE 25 MG TABS tablet 25 mg.  4  . Magnesium Oxide 200 MG TABS Take 200 mg by mouth 2 (two) times daily.    . metFORMIN (GLUCOPHAGE-XR) 500 MG 24 hr tablet Take 1,500 mg by mouth every evening.     . metoprolol succinate (TOPROL-XL) 25 MG 24 hr tablet Take 25 mg by mouth at bedtime.    . ondansetron (ZOFRAN ODT) 8 MG disintegrating tablet Take 1 tablet (8 mg total) by mouth every 8 (eight) hours as needed for nausea or vomiting. 15 tablet 0  . oxyCODONE-acetaminophen (PERCOCET/ROXICET) 5-325 MG tablet Take 1 tablet by mouth every 4 (four) hours as needed for severe pain.     [name] HYDROcodone-acetaminophen (NORCO) 5-325 MG tablet Take 1 tablet by mouth every 8 (eight) hours as needed for moderate pain.  (Patient not taking: Reported on 06/22/2016) 20 tablet 0  . HYDROmorphone (DILAUDID) 2 MG tablet Take 1 tablet (2 mg total) by mouth every 12 (twelve) hours as needed for severe pain. (Patient not taking: Reported on 01/09/2017) 20 tablet 0  . ibuprofen (ADVIL,MOTRIN) 800 MG tablet Take 1 tablet (800 mg total) by mouth every 8 (eight) hours as needed. (Patient not taking: Reported on 01/09/2017) 30 tablet 0  . tamsulosin (FLOMAX) 0.4 MG CAPS capsule Take 1 capsule (0.4 mg total) by mouth daily. (Patient not taking: Reported on 01/09/2017) 30 capsule 0   No current facility-administered medications for this visit.     Review of Systems:  GENERAL:  Feels good. Exhaustion secondary to work/stress and poor sleep.  No fevers or sweats.  Weight down 1 pound. PERFORMANCE STATUS (ECOG):  0 HEENT:  No visual changes, runny nose, sore throat, mouth sores or tenderness. Lungs: No shortness of breath or cough.  No hemoptysis. Cardiac:  No chest pain, palpitations, orthopnea, or PND. GI:  Lower abdominal/pelvic discomfort (chronic).  No nausea, vomiting, diarrhea, constipation, melena or hematochezia.  h/o polyps.  Needs colonoscopy in 3 years. GU:  No urgency, frequency, dysuria, or hematuria. Musculoskeletal:  No back pain. Left wrist pain.  Hands hurt (see HPI).  No muscle tenderness. Extremities:  No pain or swelling. Skin:  No rashes or skin changes. Neuro:  Numbness in hands at night.  Mild headache.  No weakness, balance or coordination issues. Endocrine:  Diabetes.  Thyroid nodule.  No hot flashes or night sweats. Psych:  No mood changes, depression or anxiety. Pain:  No focal pain. Review of systems:  All other systems reviewed and found to be negative.  Physical Exam: Blood pressure 117/76, pulse (!) 105, temperature 97.7 F (36.5 C), temperature source Tympanic, resp. rate 18, weight 170 lb 13.7 oz (77.5 kg). GENERAL:  Well developed, well nourished, woman sitting comfortably in the exam room in  no acute distress. MENTAL STATUS:  Alert and oriented to person, place and time. HEAD:  Short strawberry blonde hair.  Normocephalic, atraumatic, face symmetric, no Cushingoid features. EYES:  Glasses.  Blue eyes.  No conjunctivitis or scleral icterus. NEUROLOGICAL: Unremarkable. PSYCH:  Appropriate.   No visits with results within 3 Day(s) from this visit.  Latest known visit with results is:  Office Visit on 01/09/2017  Component Date Value Ref Range Status  . WBC 01/09/2017 9.6  3.6 - 11.0 K/uL Final  . RBC 01/09/2017 5.07  3.80 - 5.20 MIL/uL Final  . Hemoglobin 01/09/2017 14.4  12.0 - 16.0 g/dL Final  . HCT 01/09/2017 43.0  35.0 - 47.0 % Final  . MCV 01/09/2017 84.8  80.0 - 100.0 fL Final  . MCH 01/09/2017 28.4  26.0 - 34.0 pg Final  . MCHC 01/09/2017 33.4  32.0 - 36.0 g/dL Final  . RDW 01/09/2017 13.9  11.5 - 14.5 % Final  . Platelets 01/09/2017 132* 150 - 440 K/uL Final  . Neutrophils Relative % 01/09/2017 67  % Final  . Neutro Abs 01/09/2017 6.4  1.4 - 6.5 K/uL Final  . Lymphocytes Relative 01/09/2017 23  % Final  . Lymphs Abs 01/09/2017 2.2  1.0 - 3.6 K/uL Final  . Monocytes Relative 01/09/2017 5  % Final  . Monocytes Absolute 01/09/2017 0.4  0.2 - 0.9 K/uL Final  . Eosinophils Relative 01/09/2017 4  % Final  . Eosinophils Absolute 01/09/2017 0.4  0 - 0.7 K/uL Final  . Basophils Relative 01/09/2017 1  % Final  . Basophils Absolute 01/09/2017 0.1  0 - 0.1 K/uL Final  . Path Review 01/09/2017 Blood smear reviewed. There is very mild thrombocytopenia. RBCs and leukocytes are unremarkable. No abnormal or immature cells are seen.    Final  . Sed Rate 01/09/2017 5  0 - 30 mm/hr Final  . Anit Nuclear Antibody(ANA) 01/09/2017 Negative  Negative Final   Comment: (NOTE) Performed At: [facility] [place], [place] [id number] Lindon Romp MD [id number]   . Rhuematoid fact SerPl-aCnc 01/09/2017 14.5* 0.0 - 13.9 IU/mL Final   Comment: (NOTE) Performed  At: [facility] [place], [place] [id number] Lindon Romp MD OB:[id number]     Assessment:  JOHNELL LANDOWSKI is a 61 y.o. female with mild reactive leukocytosis.  WBC has ranged between 10,500 - 10,900.  Differential is unremarkable with predominant neutrophils.  She appears to have arthritis.  She has a history of MRSA infection.  She has had flares of eczema.  She has had a yeast infection.  She denies any oral or injected steroids.  She has a distant history of mild thrombocytopenia (02/2008 and 05/2008).  Platelet count ranged from 132,000-142,000. The etiology was unclear. SPEP and UPEP were normal. WBC ranged from 7600 - 8100. Abdominal ultrasound on 08/10/209 revealed splenic calcifications secondary to granulomatous disease, a probable gallbladder polyp, and heterogenicity of the pancreas.. As her platelet count was normal without increased bruising or bleeding, no additional testing or intervention was performed.  Work-up on 01/09/2017 revealed a hematocrit of 43, hemoglobin 14.4, MCV 84.8, platelets 132,000, white count 9600 with an ANC of 6400. Differential was unremarkable. Peripheral smear revealed very mild thrombocytopenia, normal red cells and leukocytes, and no immature cells.  ANA was negative.  Rheumatoid factor was 14.5 (0-13.9).  Sed rate was 5.    Left wrist films revealed question old trauma versus arthropathic related calcification adjacent to the ulnar styloid.  There was no acute fracture or dislocation.  There was no appreciable joint space narrowing or erosion.  There was benign appearing cystic area in the scaphoid bone.  There was benign appearing cystic area also noted in the medial hamate bone.  Symptomatically, she notes that she can't make a fist secondary to pain in her hands.  She has left wrist pain. She has achiness in her lower abdomen/ovaries for "awhile".  Plan: 1.  Review work-up.  Discuss benign peripheral smear.  Discuss  reactive leukocytosis.  She has chronic mild thrombocytopenia of unclear etiology.  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  No intervention needed. 2.  Discuss + rheumatoid factor and arthritis in hands.  Discuss  plain films of wrist.  Discuss follow-up with Dr. Astrid Divine. 3.  Discuss imaging of lower abdomen/pelvis if symptoms persist. 4.  RTC prn.   Lequita Asal, MD  01/16/2017

## 2017-01-20 ENCOUNTER — Encounter: Payer: Self-pay | Admitting: Hematology and Oncology

## 2017-01-20 DIAGNOSIS — M25532 Pain in left wrist: Secondary | ICD-10-CM | POA: Insufficient documentation

## 2017-01-20 DIAGNOSIS — R768 Other specified abnormal immunological findings in serum: Secondary | ICD-10-CM | POA: Insufficient documentation

## 2017-11-06 ENCOUNTER — Other Ambulatory Visit: Payer: Self-pay | Admitting: Family Medicine

## 2017-11-06 DIAGNOSIS — Z1231 Encounter for screening mammogram for malignant neoplasm of breast: Secondary | ICD-10-CM

## 2017-11-12 ENCOUNTER — Ambulatory Visit
Admission: RE | Admit: 2017-11-12 | Discharge: 2017-11-12 | Disposition: A | Discharge status: Home / Self Care | Payer: BLUE CROSS/BLUE SHIELD | Source: Ambulatory Visit | Attending: Family Medicine | Admitting: Family Medicine

## 2017-11-12 ENCOUNTER — Encounter (INDEPENDENT_AMBULATORY_CARE_PROVIDER_SITE_OTHER): Payer: Self-pay

## 2017-11-12 DIAGNOSIS — Z1231 Encounter for screening mammogram for malignant neoplasm of breast: Secondary | ICD-10-CM

## 2018-01-15 ENCOUNTER — Encounter
Admission: RE | Disposition: A | Discharge status: Home / Self Care | Payer: Self-pay | Source: Ambulatory Visit | Attending: Internal Medicine

## 2018-01-15 ENCOUNTER — Ambulatory Visit
Admission: RE | Admit: 2018-01-15 | Discharge: 2018-01-15 | Disposition: A | Discharge status: Home / Self Care | Payer: BLUE CROSS/BLUE SHIELD | Source: Ambulatory Visit | Attending: Internal Medicine | Admitting: Internal Medicine

## 2018-01-15 ENCOUNTER — Encounter: Payer: Self-pay | Admitting: *Deleted

## 2018-01-15 ENCOUNTER — Ambulatory Visit: Payer: BLUE CROSS/BLUE SHIELD | Admitting: Certified Registered Nurse Anesthetist

## 2018-01-15 DIAGNOSIS — Z1211 Encounter for screening for malignant neoplasm of colon: Secondary | ICD-10-CM | POA: Insufficient documentation

## 2018-01-15 DIAGNOSIS — Z86010 Personal history of colonic polyps: Secondary | ICD-10-CM | POA: Insufficient documentation

## 2018-01-15 DIAGNOSIS — F17200 Nicotine dependence, unspecified, uncomplicated: Secondary | ICD-10-CM | POA: Insufficient documentation

## 2018-01-15 DIAGNOSIS — Z85828 Personal history of other malignant neoplasm of skin: Secondary | ICD-10-CM | POA: Diagnosis not present

## 2018-01-15 DIAGNOSIS — J449 Chronic obstructive pulmonary disease, unspecified: Secondary | ICD-10-CM | POA: Diagnosis not present

## 2018-01-15 DIAGNOSIS — I472 Ventricular tachycardia: Secondary | ICD-10-CM | POA: Diagnosis not present

## 2018-01-15 DIAGNOSIS — Z7982 Long term (current) use of aspirin: Secondary | ICD-10-CM | POA: Insufficient documentation

## 2018-01-15 DIAGNOSIS — Z79899 Other long term (current) drug therapy: Secondary | ICD-10-CM | POA: Diagnosis not present

## 2018-01-15 DIAGNOSIS — E119 Type 2 diabetes mellitus without complications: Secondary | ICD-10-CM | POA: Diagnosis not present

## 2018-01-15 DIAGNOSIS — Z794 Long term (current) use of insulin: Secondary | ICD-10-CM | POA: Insufficient documentation

## 2018-01-15 DIAGNOSIS — K640 First degree hemorrhoids: Secondary | ICD-10-CM | POA: Insufficient documentation

## 2018-01-15 HISTORY — PX: COLONOSCOPY WITH PROPOFOL: SHX5780

## 2018-01-15 LAB — GLUCOSE, CAPILLARY: GLUCOSE-CAPILLARY: 194 mg/dL — AB (ref 65–99)

## 2018-01-15 SURGERY — COLONOSCOPY WITH PROPOFOL
Anesthesia: General

## 2018-01-15 MED ORDER — PROPOFOL 10 MG/ML IV BOLUS
INTRAVENOUS | Status: DC | PRN
  Administered 2018-01-15: 30 mg via INTRAVENOUS
  Administered 2018-01-15: 60 mg via INTRAVENOUS
  Administered 2018-01-15: 90 mg via INTRAVENOUS

## 2018-01-15 MED ORDER — SODIUM CHLORIDE 0.9 % IV SOLN
INTRAVENOUS | Status: DC
  Administered 2018-01-15: 07:00:00 via INTRAVENOUS

## 2018-01-15 MED ORDER — LIDOCAINE HCL (CARDIAC) 20 MG/ML IV SOLN
INTRAVENOUS | Status: DC | PRN
  Administered 2018-01-15: 50 mg via INTRAVENOUS

## 2018-01-15 MED ORDER — LIDOCAINE HCL (PF) 2 % IJ SOLN
INTRAMUSCULAR | Status: AC
  Filled 2018-01-15: qty 10

## 2018-01-15 MED ORDER — PROPOFOL 10 MG/ML IV BOLUS
INTRAVENOUS | Status: AC
  Filled 2018-01-15: qty 40

## 2018-01-15 NOTE — Transfer of Care (Signed)
Immediate Anesthesia Transfer of Care Note  Patient: Dawn Farrell  Procedure(s) Performed: COLONOSCOPY WITH PROPOFOL (N/A )  Patient Location: PACU and Endoscopy Unit  Anesthesia Type:General  Level of Consciousness: awake  Airway & Oxygen Therapy: Patient Spontanous Breathing  Post-op Assessment: Report given to RN  Post vital signs: stable  Last Vitals:  Vitals Value Taken Time  BP 111/60 01/15/2018  8:28 AM  Temp 35.9 C 01/15/2018  8:20 AM  Pulse 71 01/15/2018  8:28 AM  Resp 15 01/15/2018  8:28 AM  SpO2 95 % 01/15/2018  8:28 AM  Vitals shown include unvalidated device data.  Last Pain:  Vitals:   01/15/18 0820  TempSrc: Tympanic         Complications: No apparent anesthesia complications

## 2018-01-15 NOTE — Anesthesia Preprocedure Evaluation (Signed)
Anesthesia Evaluation  Patient identified by MRN, date of birth, ID band Patient awake    Reviewed: Allergy & Precautions, H&P , NPO status , Patient's Chart, lab work & pertinent test results  History of Anesthesia Complications Negative for: history of anesthetic complications  Airway Mallampati: III  TM Distance: >3 FB Neck ROM: full    Dental  (+) Chipped, Poor Dentition, Upper Dentures   Pulmonary COPD, Current Smoker,           Cardiovascular Exercise Tolerance: Good + dysrhythmias Supra Ventricular Tachycardia      Neuro/Psych negative neurological ROS  negative psych ROS   GI/Hepatic negative GI ROS, Neg liver ROS,   Endo/Other  diabetes, Type 2  Renal/GU Renal disease  negative genitourinary   Musculoskeletal   Abdominal   Peds  Hematology negative hematology ROS (+)   Anesthesia Other Findings Past Medical History: No date: Cancer ([place])     Comment:  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 No date: COPD (chronic obstructive pulmonary disease) ([facility]) No date: Diabetes mellitus without complication ([facility]) No date: Renal calculi  Past Surgical History: No date: ABDOMINAL HYSTERECTOMY No date: CESAREAN SECTION No date: CHOLECYSTECTOMY No date: HERNIA REPAIR No date: Oral surgery - all teeth removed  BMI    Body Mass Index:  30.11 kg/m      Reproductive/Obstetrics negative OB ROS                             Anesthesia Physical Anesthesia Plan  ASA: III  Anesthesia Plan: General   Post-op Pain Management:    Induction: Intravenous  PONV Risk Score and Plan: Propofol infusion and TIVA  Airway Management Planned: Natural Airway and Nasal Cannula  Additional Equipment:   Intra-op Plan:   Post-operative Plan:   Informed Consent: I have reviewed the patients History and Physical, chart, labs and discussed the procedure including the risks, benefits and alternatives for the proposed  anesthesia with the patient or authorized representative who has indicated his/her understanding and acceptance.   Dental Advisory Given  Plan Discussed with: Anesthesiologist, CRNA and Surgeon  Anesthesia Plan Comments: (Patient consented for risks of anesthesia including but not limited to:  - adverse reactions to medications - risk of intubation if required - damage to teeth, lips or other oral mucosa - sore throat or hoarseness - Damage to heart, brain, lungs or loss of life  Patient voiced understanding.)        Anesthesia Quick Evaluation

## 2018-01-15 NOTE — Anesthesia Postprocedure Evaluation (Signed)
Anesthesia Post Note  Patient: AMYAH CLAWSON  Procedure(s) Performed: COLONOSCOPY WITH PROPOFOL (N/A )  Patient location during evaluation: Endoscopy Anesthesia Type: General Level of consciousness: awake and alert Pain management: pain level controlled Vital Signs Assessment: post-procedure vital signs reviewed and stable Respiratory status: spontaneous breathing, nonlabored ventilation, respiratory function stable and patient connected to nasal cannula oxygen Cardiovascular status: blood pressure returned to baseline and stable Postop Assessment: no apparent nausea or vomiting Anesthetic complications: no     Last Vitals:  Vitals:   01/15/18 0840 01/15/18 0850  BP: 107/68 119/83  Pulse: 74 70  Resp: 20 14  Temp:    SpO2: 99% 99%    Last Pain:  Vitals:   01/15/18 0820  TempSrc: Tympanic                 Precious Haws Sarahelizabeth Conway

## 2018-01-15 NOTE — H&P (Signed)
Outpatient short stay form Pre-procedure 01/15/2018 8:06 AM Dawn Farrell K. [name], M.D.  Primary Physician: [name] M.D.  Reason for visit:  Personal history of colon polyps.  History of present illness:  Patient with personal hx of colon polyps. Denies rectal bleeding, abdominal pain or change in bowel habits.    Current Facility-Administered Medications:  .  0.9 %  sodium chloride infusion, , Intravenous, Continuous, [place], [name], MD, Last Rate: 20 mL/hr at 01/15/18 0725  Medications Prior to Admission  Medication Sig Dispense Refill Last Dose  . aspirin EC 81 MG tablet Take 81 mg by mouth at bedtime.   Past Week at Unknown time  . atorvastatin (LIPITOR) 40 MG tablet Take 40 mg by mouth at bedtime.    01/14/2018 at Unknown time  . enalapril (VASOTEC) 2.5 MG tablet Take 2.5 mg by mouth at bedtime.    01/14/2018 at Unknown time  . gabapentin (NEURONTIN) 300 MG capsule Take 300 mg by mouth at bedtime.  2 01/14/2018 at Unknown time  . glipiZIDE (GLUCOTROL XL) 5 MG 24 hr tablet Take 10 mg by mouth at bedtime.   01/14/2018 at Unknown time  . Insulin Detemir (LEVEMIR FLEXPEN) 100 UNIT/ML Pen Inject 50 Units into the skin at bedtime.    Past Week at Unknown time  . JARDIANCE 25 MG TABS tablet 25 mg.  4 01/14/2018 at Unknown time  . metFORMIN (GLUCOPHAGE-XR) 500 MG 24 hr tablet Take 1,500 mg by mouth every evening.    Past Week at Unknown time  . metoprolol succinate (TOPROL-XL) 25 MG 24 hr tablet Take 25 mg by mouth at bedtime.   01/14/2018 at Unknown time  . HYDROcodone-acetaminophen (NORCO) 5-325 MG tablet Take 1 tablet by mouth every 8 (eight) hours as needed for moderate pain. (Patient not taking: Reported on 06/22/2016) 20 tablet 0 Completed Course at Unknown time  . ibuprofen (ADVIL,MOTRIN) 800 MG tablet Take 1 tablet (800 mg total) by mouth every 8 (eight) hours as needed. (Patient not taking: Reported on 01/09/2017) 30 tablet 0 Not Taking at Unknown time  . Magnesium Oxide 200 MG TABS  Take 200 mg by mouth 2 (two) times daily.   Taking  . ondansetron (ZOFRAN ODT) 8 MG disintegrating tablet Take 1 tablet (8 mg total) by mouth every 8 (eight) hours as needed for nausea or vomiting. 15 tablet 0 Taking  . oxyCODONE-acetaminophen (PERCOCET/ROXICET) 5-325 MG tablet Take 1 tablet by mouth every 4 (four) hours as needed for severe pain.    Not Taking at Unknown time  . tamsulosin (FLOMAX) 0.4 MG CAPS capsule Take 1 capsule (0.4 mg total) by mouth daily. (Patient not taking: Reported on 01/09/2017) 30 capsule 0 Not Taking at Unknown time     Allergies  Allergen Reactions  . Latex Itching     Past Medical History:  Diagnosis Date  . Cancer ([place])    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  . COPD (chronic obstructive pulmonary disease) ([place])   . Diabetes mellitus without complication ([place])   . Renal calculi     Review of systems:      Physical Exam  Gen: Alert, oriented. Appears stated age.  HEENT: [place]/AT. PERRLA. Lungs: CTA, no wheezes. CV: RR nl S1, S2. Abd: soft, benign, no masses. BS+ Ext: No edema. Pulses 2+    Planned procedures: Colonoscopy. The patient understands the nature of the planned procedure, indications, risks, alternatives and potential complications including but not limited to bleeding, infection, perforation, damage to internal organs and possible  oversedation/side effects from anesthesia. The patient agrees and gives consent to proceed.  Please refer to procedure notes for findings, recommendations and patient disposition/instructions.    [name] K. [name], M.D. Gastroenterology 01/15/2018  8:06 AM

## 2018-01-15 NOTE — Op Note (Signed)
[facility] Gastroenterology Patient Name: Dawn Farrell Procedure Date: 01/15/2018 7:48 AM MRN: [id number] Account #: [id number] Date of Birth: 1956-01-03 Admit Type: Outpatient Age: 62 Room: [facility] ENDO ROOM 3 Gender: Female Note Status: Finalized Procedure:            Colonoscopy Indications:          High risk colon cancer surveillance: Personal history                        of colonic polyps Providers:            [name]. [name] MD, MD Referring MD:         [name] MD, MD (Referring MD) Medicines:            Propofol per Anesthesia Complications:        No immediate complications. Procedure:            Pre-Anesthesia Assessment:                       - The risks and benefits of the procedure and the                        sedation options and risks were discussed with the                        patient. All questions were answered and informed                        consent was obtained.                       - Patient identification and proposed procedure were                        verified prior to the procedure by the nurse. The                        procedure was verified in the procedure room.                       - ASA Grade Assessment: III - A patient with severe                        systemic disease.                       - After reviewing the risks and benefits, the patient                        was deemed in satisfactory condition to undergo the                        procedure.                       After obtaining informed consent, the colonoscope was                        passed under direct vision. Throughout the procedure,  the patient's blood pressure, pulse, and oxygen                        saturations were monitored continuously. The                        Colonoscope was introduced through the anus and                        advanced to the the cecum, identified by appendiceal   orifice and ileocecal valve. The colonoscopy was                        performed without difficulty. The patient tolerated the                        procedure well. The quality of the bowel preparation                        was good. The ileocecal valve, appendiceal orifice, and                        rectum were photographed. Findings:      The perianal and digital rectal examinations were normal. Pertinent       negatives include normal sphincter tone and no palpable rectal lesions.      Non-bleeding internal hemorrhoids were found during retroflexion. The       hemorrhoids were Grade I (internal hemorrhoids that do not prolapse).      The exam was otherwise without abnormality. Impression:           - Non-bleeding internal hemorrhoids.                       - The examination was otherwise normal.                       - No specimens collected. Recommendation:       - Patient has a contact number available for                        emergencies. The signs and symptoms of potential                        delayed complications were discussed with the patient.                        Return to normal activities tomorrow. Written discharge                        instructions were provided to the patient.                       - Resume previous diet.                       - Continue present medications.                       - Repeat colonoscopy in 5 years for surveillance.                       -  The findings and recommendations were discussed with                        the patient and their family.                       - Return to GI office PRN. Procedure Code(s):    --- Professional ---                       F7902, Colorectal cancer screening; colonoscopy on                        individual at high risk Diagnosis Code(s):    --- Professional ---                       K64.0, First degree hemorrhoids                       Z86.010, Personal history of colonic polyps CPT copyright  2016 American Medical Association. All rights reserved. The codes documented in this report are preliminary and upon coder review may  be revised to meet current compliance requirements. Efrain Sella MD, MD 01/15/2018 8:29:28 AM This report has been signed electronically. Number of Addenda: 0 Note Initiated On: 01/15/2018 7:48 AM Scope Withdrawal Time: 0 hours 6 minutes 7 seconds  Total Procedure Duration: 0 hours 9 minutes 36 seconds       [facility]

## 2018-01-15 NOTE — Anesthesia Post-op Follow-up Note (Signed)
Anesthesia QCDR form completed.        

## 2018-01-15 NOTE — Interval H&P Note (Signed)
History and Physical Interval Note:  01/15/2018 8:09 AM  Dawn Farrell  has presented today for surgery, with the diagnosis of PH POLYPS  The various methods of treatment have been discussed with the patient and family. After consideration of risks, benefits and other options for treatment, the patient has consented to  Procedure(s): COLONOSCOPY WITH PROPOFOL (N/A) as a surgical intervention .  The patient's history has been reviewed, patient examined, no change in status, stable for surgery.  I have reviewed the patient's chart and labs.  Questions were answered to the patient's satisfaction.     [place], [place]

## 2018-04-15 ENCOUNTER — Ambulatory Visit
Admission: EM | Admit: 2018-04-15 | Discharge: 2018-04-15 | Disposition: A | Discharge status: Home / Self Care | Payer: BLUE CROSS/BLUE SHIELD | Attending: Family Medicine | Admitting: Family Medicine

## 2018-04-15 ENCOUNTER — Other Ambulatory Visit: Payer: Self-pay

## 2018-04-15 DIAGNOSIS — E119 Type 2 diabetes mellitus without complications: Secondary | ICD-10-CM

## 2018-04-15 DIAGNOSIS — R197 Diarrhea, unspecified: Secondary | ICD-10-CM

## 2018-04-15 DIAGNOSIS — R112 Nausea with vomiting, unspecified: Secondary | ICD-10-CM

## 2018-04-15 LAB — CBC WITH DIFFERENTIAL/PLATELET
Basophils Absolute: 0 10*3/uL (ref 0–0.1)
Basophils Relative: 0 %
Eosinophils Absolute: 0 10*3/uL (ref 0–0.7)
Eosinophils Relative: 0 %
HCT: 41.1 % (ref 35.0–47.0)
Hemoglobin: 13.7 g/dL (ref 12.0–16.0)
Lymphocytes Relative: 9 %
Lymphs Abs: 1 10*3/uL (ref 1.0–3.6)
MCH: 28.6 pg (ref 26.0–34.0)
MCHC: 33.2 g/dL (ref 32.0–36.0)
MCV: 86.1 fL (ref 80.0–100.0)
Monocytes Absolute: 0.8 10*3/uL (ref 0.2–0.9)
Monocytes Relative: 7 %
Neutro Abs: 9.5 10*3/uL — ABNORMAL HIGH (ref 1.4–6.5)
Neutrophils Relative %: 84 %
Platelets: 106 10*3/uL — ABNORMAL LOW (ref 150–440)
RBC: 4.77 MIL/uL (ref 3.80–5.20)
RDW: 13.5 % (ref 11.5–14.5)
WBC: 11.4 10*3/uL — ABNORMAL HIGH (ref 3.6–11.0)

## 2018-04-15 LAB — COMPREHENSIVE METABOLIC PANEL
ALT: 27 U/L (ref 0–44)
AST: 27 U/L (ref 15–41)
Albumin: 4.5 g/dL (ref 3.5–5.0)
Alkaline Phosphatase: 73 U/L (ref 38–126)
Anion gap: 12 (ref 5–15)
BUN: 22 mg/dL (ref 8–23)
CO2: 24 mmol/L (ref 22–32)
Calcium: 9.1 mg/dL (ref 8.9–10.3)
Chloride: 98 mmol/L (ref 98–111)
Creatinine, Ser: 0.99 mg/dL (ref 0.44–1.00)
GFR calc Af Amer: >60 mL/min (ref >60)
GFR calc non Af Amer: >60 mL/min (ref >60)
Glucose, Bld: 76 mg/dL (ref 70–99)
Potassium: 3.8 mmol/L (ref 3.5–5.1)
Sodium: 134 mmol/L — ABNORMAL LOW (ref 135–145)
Total Bilirubin: 1 mg/dL (ref 0.3–1.2)
Total Protein: 8.1 g/dL (ref 6.5–8.1)

## 2018-04-15 MED ORDER — ONDANSETRON 8 MG PO TBDP: 8.0000 mg | ORAL_TABLET | Freq: Two times a day (BID) | ORAL | 0 refills | Status: DC

## 2018-04-15 MED ORDER — ONDANSETRON 8 MG PO TBDP
8.0000 mg | ORAL_TABLET | Freq: Once | ORAL | Status: AC
  Administered 2018-04-15: 8 mg via ORAL

## 2018-04-15 NOTE — ED Provider Notes (Signed)
[facility]    CSN: [id number] Arrival date & time: 04/15/18  0804     History   Chief Complaint Chief Complaint  Patient presents with  . Diarrhea    HPI Dawn Farrell is a 62 y.o. female.   HPI  62 year old insulin dependent diabetic presents with diarrhea that started 4 days ago.  She just returned from a trip to the beach.  She states that she was eating outside of the home on numerous occasions.  Diarrhea actually started on her last day at the beach.  Started vomiting 2 days ago.  She states on average she would have 3-4 bowel movements a day which  she describes as watery but without blood or mucus.  Vomiting about 2-3 times a day.  Today  however she has been able to keep liquids down for the first time.  What seems to be unusual is the body aches associated with the diarrhea illness.  Temperature today is 99.4.  Blood pressure 141/84 O2 sats 98% on room air.  States that her husband who accompanied her to the beach  never did get sick.  Mild bilateral lower quadrant LE pain.  She has had a history of kidney stones which do not seem to be a source of her problem this time because the pain is much different.         Past Medical History:  Diagnosis Date  . Cancer ([place])    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  . COPD (chronic obstructive pulmonary disease) ([place])   . Diabetes mellitus without complication ([place])   . Renal calculi     Patient Active Problem List   Diagnosis Date Noted  . Elevated rheumatoid factor 01/20/2017  . Left wrist pain 01/20/2017  . Leukocytosis 01/09/2017  . Thrombocytopenia ([place]) 01/09/2017    Past Surgical History:  Procedure Laterality Date  . ABDOMINAL HYSTERECTOMY    . CESAREAN SECTION    . CHOLECYSTECTOMY    . COLONOSCOPY WITH PROPOFOL N/A 01/15/2018   Procedure: COLONOSCOPY WITH PROPOFOL;  Surgeon: [place], [name], MD;  Location: [facility] ENDOSCOPY;  Service: Gastroenterology;  Laterality: N/A;  . HERNIA REPAIR    . Oral surgery - all  teeth removed      OB History   None      Home Medications    Prior to Admission medications   Medication Sig Start Date End Date Taking? Authorizing Provider  aspirin EC 81 MG tablet Take 81 mg by mouth at bedtime.   Yes [provider]  atorvastatin (LIPITOR) 40 MG tablet Take 40 mg by mouth at bedtime.    Yes [provider]  enalapril (VASOTEC) 2.5 MG tablet Take 2.5 mg by mouth at bedtime.    Yes [provider]  gabapentin (NEURONTIN) 300 MG capsule Take 300 mg by mouth at bedtime. 12/26/16  Yes [provider]  glipiZIDE (GLUCOTROL XL) 5 MG 24 hr tablet Take 10 mg by mouth at bedtime.   Yes [provider]  Insulin Detemir (LEVEMIR FLEXPEN) 100 UNIT/ML Pen Inject 50 Units into the skin at bedtime.    Yes [provider]  JARDIANCE 25 MG TABS tablet 25 mg. 12/27/16  Yes [provider]  Magnesium Oxide 200 MG TABS Take 200 mg by mouth 2 (two) times daily.   Yes [provider]  metFORMIN (GLUCOPHAGE-XR) 500 MG 24 hr tablet Take 1,500 mg by mouth every evening.    Yes [provider]  metoprolol succinate (TOPROL-XL)  25 MG 24 hr tablet Take 25 mg by mouth at bedtime.   Yes [provider]  ondansetron (ZOFRAN ODT) 8 MG disintegrating tablet Take 1 tablet (8 mg total) by mouth 2 (two) times daily. 04/15/18   Lorin Picket, PA-C    Family History Family History  Problem Relation Age of Onset  . Heart failure Mother   . Heart failure Father   . Breast cancer Sister 7    Social History Social History   Tobacco Use  . Smoking status: Current Every Day Smoker    Packs/day: 1.00  . Smokeless tobacco: Never Used  Substance Use Topics  . Alcohol use: No  . Drug use: No     Allergies   Latex   Review of Systems Review of Systems  Constitutional: Positive for activity change, appetite change, chills and fatigue. Negative for fever.  Gastrointestinal: Positive for abdominal pain,  diarrhea, nausea and vomiting.  All other systems reviewed and are negative.    Physical Exam Triage Vital Signs ED Triage Vitals  Enc Vitals Group     BP 04/15/18 0820 (!) 141/84     Pulse Rate 04/15/18 0820 87     Resp 04/15/18 0820 18     Temp 04/15/18 0820 99.4 F (37.4 C)     Temp Source 04/15/18 0820 Oral     SpO2 04/15/18 0820 98 %     Weight 04/15/18 0818 170 lb (77.1 kg)     Height 04/15/18 0818 5\' 3"  (1.6 m)     Head Circumference --      Peak Flow --      Pain Score 04/15/18 0817 3     Pain Loc --      Pain Edu? --      Excl. in [place]? --    No data found.  Updated Vital Signs BP (!) 141/84 (BP Location: Left Arm)   Pulse 87   Temp 99.4 F (37.4 C) (Oral)   Resp 18   Ht 5\' 3"  (1.6 m)   Wt 170 lb (77.1 kg)   SpO2 98%   BMI 30.11 kg/m   Visual Acuity Right Eye Distance:   Left Eye Distance:   Bilateral Distance:    Right Eye Near:   Left Eye Near:    Bilateral Near:     Physical Exam  Constitutional: She appears well-developed and well-nourished. No distress.  HENT:  Head: Normocephalic.  Eyes: Pupils are equal, round, and reactive to light. Right eye exhibits no discharge. Left eye exhibits no discharge.  Neck: Normal range of motion.  Pulmonary/Chest: Effort normal and breath sounds normal.  Abdominal: Soft. Bowel sounds are normal. She exhibits no distension. There is no tenderness. There is no rebound and no guarding.  Has no CVA tenderness.  Examination was performed with Gwinda Passe, RN chaperone and assistant  Skin: She is not diaphoretic.  Nursing note and vitals reviewed.    UC Treatments / Results  Labs (all labs ordered are listed, but only abnormal results are displayed) Labs Reviewed  CBC WITH DIFFERENTIAL/PLATELET - Abnormal; Notable for the following components:      Result Value   WBC 11.4 (*)    Platelets 106 (*)    Neutro Abs 9.5 (*)    All other components within normal limits  COMPREHENSIVE METABOLIC PANEL - Abnormal; Notable  for the following components:   Sodium 134 (*)    All other components within normal limits    EKG None  Radiology No results found.  Procedures Procedures (including critical care time)  Medications Ordered in UC Medications  ondansetron (ZOFRAN-ODT) disintegrating tablet 8 mg (8 mg Oral Given 04/15/18 0844)    Initial Impression / Assessment and Plan / UC Course  I have reviewed the triage vital signs and the nursing notes.  Pertinent labs & imaging results that were available during my care of the patient were reviewed by me and considered in my medical decision making (see chart for details).     Plan: 1. Test/x-ray results and diagnosis reviewed with patient 2. rx as per orders; risks, benefits, potential side effects reviewed with patient 3. Recommend supportive treatment with forcing hydration.  When returning to solid foods begin with a brat diet and advance as tolerated not improving follow-up with your primary care physician or worsening go to the emergency room 4. F/u prn if symptoms worsen or don't improve  Final Clinical Impressions(s) / UC Diagnoses   Final diagnoses:  Nausea vomiting and diarrhea     Discharge Instructions     Drink plenty of fluids.   When Returning to solid foods ,begin with the BRAT diet.  If you are not improving recommend following up with your primary care physician.  If you worsen recommend going to the emergency room.    ED Prescriptions    Medication Sig Dispense Auth. Provider   ondansetron (ZOFRAN ODT) 8 MG disintegrating tablet Take 1 tablet (8 mg total) by mouth 2 (two) times daily. 6 tablet Lorin Picket, PA-C     Controlled Substance Prescriptions [place] Controlled Substance Registry consulted? Not Applicable   Lorin Picket, PA-C 04/15/18 3810

## 2018-04-15 NOTE — Discharge Instructions (Signed)
Drink plenty of fluids.   When Returning to solid foods ,begin with the BRAT diet.  If you are not improving recommend following up with your primary care physician.  If you worsen recommend going to the emergency room.

## 2018-04-15 NOTE — ED Triage Notes (Signed)
Patient complains of diarrhea onset 4 days ago, states that vomiting started 2 days ago, able to keep liquids down today. Patient reports that she has been having body aches and joint pain since the start.

## 2018-08-30 ENCOUNTER — Ambulatory Visit
Admission: EM | Admit: 2018-08-30 | Discharge: 2018-08-30 | Disposition: A | Discharge status: Home / Self Care | Payer: BLUE CROSS/BLUE SHIELD | Attending: Family Medicine | Admitting: Family Medicine

## 2018-08-30 ENCOUNTER — Other Ambulatory Visit: Payer: Self-pay

## 2018-08-30 DIAGNOSIS — J441 Chronic obstructive pulmonary disease with (acute) exacerbation: Secondary | ICD-10-CM

## 2018-08-30 MED ORDER — PREDNISONE 20 MG PO TABS: 20.0000 mg | ORAL_TABLET | Freq: Every day | ORAL | 0 refills | Status: DC

## 2018-08-30 MED ORDER — ALBUTEROL SULFATE HFA 108 (90 BASE) MCG/ACT IN AERS: 1.0000 | INHALATION_SPRAY | Freq: Four times a day (QID) | RESPIRATORY_TRACT | 0 refills | Status: DC | PRN

## 2018-08-30 MED ORDER — DOXYCYCLINE HYCLATE 100 MG PO TABS: 100.0000 mg | ORAL_TABLET | Freq: Two times a day (BID) | ORAL | 0 refills | Status: DC

## 2018-08-30 NOTE — ED Triage Notes (Signed)
Patient complains of cough and congestion, sinus pain and pressure x 1 week. Patient states that she has not been improving.

## 2018-08-30 NOTE — ED Provider Notes (Signed)
[facility]    CSN: [id number] Arrival date & time: 08/30/18  0934     History   Chief Complaint Chief Complaint  Patient presents with  . Cough    HPI BRYANDA MIKEL is a 62 y.o. female.   The history is provided by the patient.  Cough  URI  Presenting symptoms: congestion, cough and fatigue   Severity:  Moderate Onset quality:  Sudden Timing:  Constant Progression:  Worsening Chronicity:  New Relieved by:  Nothing Ineffective treatments:  OTC medications Risk factors: chronic respiratory disease, diabetes mellitus and sick contacts     Past Medical History:  Diagnosis Date  . 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 COPD (chronic obstructive pulmonary disease) ([place])   . Diabetes mellitus without complication ([place])   . Renal calculi     Patient Active Problem List   Diagnosis Date Noted  . Elevated rheumatoid factor 01/20/2017  . Left wrist pain 01/20/2017  . Leukocytosis 01/09/2017  . Thrombocytopenia ([place]) 01/09/2017    Past Surgical History:  Procedure Laterality Date  . ABDOMINAL HYSTERECTOMY    . CESAREAN SECTION    . CHOLECYSTECTOMY    . COLONOSCOPY WITH PROPOFOL N/A 01/15/2018   Procedure: COLONOSCOPY WITH PROPOFOL;  Surgeon: Toledo, [name], MD;  Location: [facility] ENDOSCOPY;  Service: Gastroenterology;  Laterality: N/A;  . HERNIA REPAIR    . Oral surgery - all teeth removed      OB History   None      Home Medications    Prior to Admission medications   Medication Sig Start Date End Date Taking? Authorizing Provider  aspirin EC 81 MG tablet Take 81 mg by mouth at bedtime.   Yes [provider]  atorvastatin (LIPITOR) 40 MG tablet Take 40 mg by mouth at bedtime.    Yes [provider]  enalapril (VASOTEC) 2.5 MG tablet Take 2.5 mg by mouth at bedtime.    Yes [provider]  gabapentin (NEURONTIN) 300 MG capsule Take 300 mg by mouth at bedtime. 12/26/16  Yes [provider]  glipiZIDE (GLUCOTROL  XL) 5 MG 24 hr tablet Take 10 mg by mouth at bedtime.   Yes [provider]  Insulin Detemir (LEVEMIR FLEXPEN) 100 UNIT/ML Pen Inject 50 Units into the skin at bedtime.    Yes [provider]  JARDIANCE 25 MG TABS tablet 25 mg. 12/27/16  Yes [provider]  Magnesium Oxide 200 MG TABS Take 200 mg by mouth 2 (two) times daily.   Yes [provider]  metFORMIN (GLUCOPHAGE-XR) 500 MG 24 hr tablet Take 1,500 mg by mouth every evening.    Yes [provider]  metoprolol succinate (TOPROL-XL) 25 MG 24 hr tablet Take 25 mg by mouth at bedtime.   Yes [provider]  ondansetron (ZOFRAN ODT) 8 MG disintegrating tablet Take 1 tablet (8 mg total) by mouth 2 (two) times daily. 04/15/18  Yes Lorin Picket, PA-C  albuterol (PROVENTIL HFA;VENTOLIN HFA) 108 (90 Base) MCG/ACT inhaler Inhale 1-2 puffs into the lungs every 6 (six) hours as needed for wheezing or shortness of breath. 08/30/18   Norval Gable, MD  doxycycline (VIBRA-TABS) 100 MG tablet Take 1 tablet (100 mg total) by mouth 2 (two) times daily. 08/30/18   Norval Gable, MD  predniSONE (DELTASONE) 20 MG tablet Take 1 tablet (20 mg total) by mouth daily. 08/30/18   Norval Gable, MD    Family History Family History  Problem Relation Age of Onset  . Heart failure Mother   . Heart failure Father   . Breast cancer Sister 22    Social History Social History   Tobacco Use  . Smoking status: Current Every Day Smoker    Packs/day: 0.25  . Smokeless tobacco: Never Used  Substance Use Topics  . Alcohol use: No  . Drug use: No     Allergies   Latex   Review of Systems Review of Systems  Constitutional: Positive for fatigue.  HENT: Positive for congestion.   Respiratory: Positive for cough.      Physical Exam Triage Vital Signs ED Triage Vitals  Enc Vitals Group     BP 08/30/18 0945 137/88     Pulse Rate 08/30/18 0945 67     Resp 08/30/18 0945 18     Temp 08/30/18 0945 98 F  (36.7 C)     Temp Source 08/30/18 0945 Oral     SpO2 08/30/18 0945 96 %     Weight 08/30/18 0943 170 lb (77.1 kg)     Height 08/30/18 0943 5\' 3"  (1.6 m)     Head Circumference --      Peak Flow --      Pain Score 08/30/18 0943 0     Pain Loc --      Pain Edu? --      Excl. in [place]? --    No data found.  Updated Vital Signs BP 137/88 (BP Location: Left Arm)   Pulse 67   Temp 98 F (36.7 C) (Oral)   Resp 18   Ht 5\' 3"  (1.6 m)   Wt 77.1 kg   SpO2 96%   BMI 30.11 kg/m   Visual Acuity Right Eye Distance:   Left Eye Distance:   Bilateral Distance:    Right Eye Near:   Left Eye Near:    Bilateral Near:     Physical Exam  Constitutional: She appears well-developed and well-nourished. No distress.  HENT:  Head: Normocephalic and atraumatic.  Right Ear: Tympanic membrane, external ear and ear canal normal.  Left Ear: Tympanic membrane, external ear and ear canal normal.  Nose: Mucosal edema and rhinorrhea present. No nose lacerations, sinus tenderness, nasal deformity, septal deviation or nasal septal hematoma. No epistaxis.  No foreign bodies. Right sinus exhibits no maxillary sinus tenderness and no frontal sinus tenderness. Left sinus exhibits no maxillary sinus tenderness and no frontal sinus tenderness.  Mouth/Throat: Uvula is midline, oropharynx is clear and moist and mucous membranes are normal. No oropharyngeal exudate.  Eyes: Pupils are equal, round, and reactive to light. Conjunctivae and EOM are normal. Right eye exhibits no discharge. Left eye exhibits no discharge. No scleral icterus.  Neck: Normal range of motion. Neck supple. No thyromegaly present.  Cardiovascular: Normal rate, regular rhythm and normal heart sounds.  Pulmonary/Chest: Effort normal. No stridor. No respiratory distress. She has wheezes (diffuse and rhonchi). She has no rales.  Lymphadenopathy:    She has no cervical adenopathy.  Skin: She is not diaphoretic.  Nursing note and vitals  reviewed.    UC Treatments / Results  Labs (all labs ordered are listed, but only abnormal results are displayed) Labs Reviewed - No data to display  EKG None  Radiology No results found.  Procedures Procedures (including critical care time)  Medications Ordered in UC Medications - No data to display  Initial Impression / Assessment and Plan / UC Course  I have reviewed the triage vital  signs and the nursing notes.  Pertinent labs & imaging results that were available during my care of the patient were reviewed by me and considered in my medical decision making (see chart for details).      Final Clinical Impressions(s) / UC Diagnoses   Final diagnoses:  COPD exacerbation [facility])   Discharge Instructions   None    ED Prescriptions    Medication Sig Dispense Auth. Provider   doxycycline (VIBRA-TABS) 100 MG tablet Take 1 tablet (100 mg total) by mouth 2 (two) times daily. 20 tablet Norval Gable, MD   albuterol (PROVENTIL HFA;VENTOLIN HFA) 108 (90 Base) MCG/ACT inhaler Inhale 1-2 puffs into the lungs every 6 (six) hours as needed for wheezing or shortness of breath. [place], MD   predniSONE (DELTASONE) 20 MG tablet Take 1 tablet (20 mg total) by mouth daily. 5 tablet Norval Gable, MD      1. diagnosis reviewed with patient 2. rx as per orders above; reviewed possible side effects, interactions, risks and benefits  3. Recommend supportive treatment rest, fluids 4. Follow-up prn if symptoms worsen or don't improve   Controlled Substance Prescriptions [place] Controlled Substance Registry consulted? Not Applicable   Norval Gable, MD 08/30/18 1025

## 2019-02-15 DIAGNOSIS — E042 Nontoxic multinodular goiter: Secondary | ICD-10-CM | POA: Insufficient documentation

## 2019-12-04 ENCOUNTER — Emergency Department: Payer: 59

## 2019-12-04 ENCOUNTER — Other Ambulatory Visit: Payer: Self-pay

## 2019-12-04 ENCOUNTER — Encounter: Payer: Self-pay | Admitting: Emergency Medicine

## 2019-12-04 ENCOUNTER — Ambulatory Visit (INDEPENDENT_AMBULATORY_CARE_PROVIDER_SITE_OTHER): Payer: 59

## 2019-12-04 ENCOUNTER — Emergency Department
Admission: EM | Admit: 2019-12-04 | Discharge: 2019-12-04 | Disposition: A | Discharge status: Home / Self Care | Payer: 59 | Source: Home / Self Care | Attending: Student in an Organized Health Care Education/Training Program | Admitting: Student in an Organized Health Care Education/Training Program

## 2019-12-04 ENCOUNTER — Ambulatory Visit (INDEPENDENT_AMBULATORY_CARE_PROVIDER_SITE_OTHER)
Admission: EM | Admit: 2019-12-04 | Discharge: 2019-12-04 | Disposition: A | Discharge status: Home / Self Care | Payer: 59 | Source: Home / Self Care

## 2019-12-04 DIAGNOSIS — R000 Tachycardia, unspecified: Secondary | ICD-10-CM | POA: Insufficient documentation

## 2019-12-04 DIAGNOSIS — R509 Fever, unspecified: Secondary | ICD-10-CM | POA: Insufficient documentation

## 2019-12-04 DIAGNOSIS — D72829 Elevated white blood cell count, unspecified: Secondary | ICD-10-CM | POA: Insufficient documentation

## 2019-12-04 DIAGNOSIS — Z794 Long term (current) use of insulin: Secondary | ICD-10-CM | POA: Insufficient documentation

## 2019-12-04 DIAGNOSIS — N12 Tubulo-interstitial nephritis, not specified as acute or chronic: Secondary | ICD-10-CM | POA: Insufficient documentation

## 2019-12-04 DIAGNOSIS — M545 Low back pain: Secondary | ICD-10-CM

## 2019-12-04 DIAGNOSIS — Z85828 Personal history of other malignant neoplasm of skin: Secondary | ICD-10-CM | POA: Insufficient documentation

## 2019-12-04 DIAGNOSIS — R109 Unspecified abdominal pain: Secondary | ICD-10-CM | POA: Insufficient documentation

## 2019-12-04 DIAGNOSIS — Z91040 Latex allergy status: Secondary | ICD-10-CM | POA: Insufficient documentation

## 2019-12-04 DIAGNOSIS — Z7982 Long term (current) use of aspirin: Secondary | ICD-10-CM | POA: Insufficient documentation

## 2019-12-04 DIAGNOSIS — R112 Nausea with vomiting, unspecified: Secondary | ICD-10-CM | POA: Diagnosis not present

## 2019-12-04 DIAGNOSIS — F17200 Nicotine dependence, unspecified, uncomplicated: Secondary | ICD-10-CM | POA: Insufficient documentation

## 2019-12-04 DIAGNOSIS — Z79899 Other long term (current) drug therapy: Secondary | ICD-10-CM | POA: Insufficient documentation

## 2019-12-04 DIAGNOSIS — E119 Type 2 diabetes mellitus without complications: Secondary | ICD-10-CM | POA: Insufficient documentation

## 2019-12-04 DIAGNOSIS — J449 Chronic obstructive pulmonary disease, unspecified: Secondary | ICD-10-CM | POA: Insufficient documentation

## 2019-12-04 LAB — COMPREHENSIVE METABOLIC PANEL
ALT: 18 U/L (ref 0–44)
AST: 17 U/L (ref 15–41)
Albumin: 4.2 g/dL (ref 3.5–5.0)
Alkaline Phosphatase: 80 U/L (ref 38–126)
Anion gap: 9 (ref 5–15)
BUN: 23 mg/dL (ref 8–23)
CO2: 23 mmol/L (ref 22–32)
Calcium: 9.1 mg/dL (ref 8.9–10.3)
Chloride: 101 mmol/L (ref 98–111)
Creatinine, Ser: 1 mg/dL (ref 0.44–1.00)
GFR calc Af Amer: >60 mL/min (ref >60)
GFR calc non Af Amer: 60 mL/min — ABNORMAL LOW (ref >60)
Glucose, Bld: 76 mg/dL (ref 70–99)
Potassium: 3.9 mmol/L (ref 3.5–5.1)
Sodium: 133 mmol/L — ABNORMAL LOW (ref 135–145)
Total Bilirubin: 1.1 mg/dL (ref 0.3–1.2)
Total Protein: 7.3 g/dL (ref 6.5–8.1)

## 2019-12-04 LAB — URINALYSIS, COMPLETE (UACMP) WITH MICROSCOPIC
Bacteria, UA: NONE SEEN
Bilirubin Urine: NEGATIVE
Bilirubin Urine: NEGATIVE
Glucose, UA: >1000 mg/dL — AB
Glucose, UA: >=500 mg/dL — AB
Ketones, ur: NEGATIVE mg/dL
Ketones, ur: NEGATIVE mg/dL
Leukocytes,Ua: NEGATIVE
Leukocytes,Ua: NEGATIVE
Nitrite: NEGATIVE
Nitrite: NEGATIVE
Protein, ur: 30 mg/dL — AB
Protein, ur: 30 mg/dL — AB
Specific Gravity, Urine: 1.02 (ref 1.005–1.030)
Specific Gravity, Urine: >1.046 — ABNORMAL HIGH (ref 1.005–1.030)
pH: 5.5 (ref 5.0–8.0)
pH: 5 (ref 5.0–8.0)

## 2019-12-04 LAB — CBC WITH DIFFERENTIAL/PLATELET
Abs Immature Granulocytes: 0.11 10*3/uL — ABNORMAL HIGH (ref 0.00–0.07)
Abs Immature Granulocytes: 0.08 10*3/uL — ABNORMAL HIGH (ref 0.00–0.07)
Basophils Absolute: 0.1 10*3/uL (ref 0.0–0.1)
Basophils Absolute: 0.1 10*3/uL (ref 0.0–0.1)
Basophils Relative: 0 %
Basophils Relative: 0 %
Eosinophils Absolute: 0 10*3/uL (ref 0.0–0.5)
Eosinophils Absolute: 0 10*3/uL (ref 0.0–0.5)
Eosinophils Relative: 0 %
Eosinophils Relative: 0 %
HCT: 41 % (ref 36.0–46.0)
HCT: 38 % (ref 36.0–46.0)
Hemoglobin: 13.6 g/dL (ref 12.0–15.0)
Hemoglobin: 12.5 g/dL (ref 12.0–15.0)
Immature Granulocytes: 1 %
Immature Granulocytes: 1 %
Lymphocytes Relative: 6 %
Lymphocytes Relative: 6 %
Lymphs Abs: 1.1 10*3/uL (ref 0.7–4.0)
Lymphs Abs: 1 10*3/uL (ref 0.7–4.0)
MCH: 28.3 pg (ref 26.0–34.0)
MCH: 28.3 pg (ref 26.0–34.0)
MCHC: 33.2 g/dL (ref 30.0–36.0)
MCHC: 32.9 g/dL (ref 30.0–36.0)
MCV: 85.2 fL (ref 80.0–100.0)
MCV: 86 fL (ref 80.0–100.0)
Monocytes Absolute: 0.7 10*3/uL (ref 0.1–1.0)
Monocytes Absolute: 0.8 10*3/uL (ref 0.1–1.0)
Monocytes Relative: 4 %
Monocytes Relative: 5 %
Neutro Abs: 16.6 10*3/uL — ABNORMAL HIGH (ref 1.7–7.7)
Neutro Abs: 13.6 10*3/uL — ABNORMAL HIGH (ref 1.7–7.7)
Neutrophils Relative %: 89 %
Neutrophils Relative %: 88 %
Platelets: 137 10*3/uL — ABNORMAL LOW (ref 150–400)
Platelets: 124 10*3/uL — ABNORMAL LOW (ref 150–400)
RBC: 4.81 MIL/uL (ref 3.87–5.11)
RBC: 4.42 MIL/uL (ref 3.87–5.11)
RDW: 13.4 % (ref 11.5–15.5)
RDW: 13.2 % (ref 11.5–15.5)
WBC: 18.6 10*3/uL — ABNORMAL HIGH (ref 4.0–10.5)
WBC: 15.5 10*3/uL — ABNORMAL HIGH (ref 4.0–10.5)
nRBC: 0 % (ref 0.0–0.2)
nRBC: 0 % (ref 0.0–0.2)

## 2019-12-04 LAB — BASIC METABOLIC PANEL
Anion gap: 7 (ref 5–15)
BUN: 22 mg/dL (ref 8–23)
CO2: 27 mmol/L (ref 22–32)
Calcium: 9.1 mg/dL (ref 8.9–10.3)
Chloride: 99 mmol/L (ref 98–111)
Creatinine, Ser: 0.94 mg/dL (ref 0.44–1.00)
GFR calc Af Amer: >60 mL/min (ref >60)
GFR calc non Af Amer: >60 mL/min (ref >60)
Glucose, Bld: 89 mg/dL (ref 70–99)
Potassium: 4.3 mmol/L (ref 3.5–5.1)
Sodium: 133 mmol/L — ABNORMAL LOW (ref 135–145)

## 2019-12-04 LAB — LACTIC ACID, PLASMA: Lactic Acid, Venous: 0.8 mmol/L (ref 0.5–1.9)

## 2019-12-04 MED ORDER — CEPHALEXIN 500 MG PO CAPS: 500.0000 mg | ORAL_CAPSULE | Freq: Three times a day (TID) | ORAL | 0 refills | Status: DC

## 2019-12-04 MED ORDER — SODIUM CHLORIDE 0.9 % IV SOLN
2.0000 g | Freq: Once | INTRAVENOUS | Status: AC
  Administered 2019-12-04: 13:00:00 2 g via INTRAVENOUS
  Filled 2019-12-04: qty 20

## 2019-12-04 MED ORDER — ONDANSETRON HCL 4 MG PO TABS: 4.0000 mg | ORAL_TABLET | Freq: Every day | ORAL | 0 refills | Status: DC | PRN

## 2019-12-04 MED ORDER — IOHEXOL 300 MG/ML  SOLN
100.0000 mL | Freq: Once | INTRAMUSCULAR | Status: AC | PRN
  Administered 2019-12-04: 14:00:00 100 mL via INTRAVENOUS

## 2019-12-04 MED ORDER — SODIUM CHLORIDE 0.9 % IV BOLUS
1000.0000 mL | Freq: Once | INTRAVENOUS | Status: AC
  Administered 2019-12-04: 13:00:00 1000 mL via INTRAVENOUS

## 2019-12-04 MED ORDER — ACETAMINOPHEN 325 MG PO TABS
650.0000 mg | ORAL_TABLET | Freq: Once | ORAL | Status: AC
  Administered 2019-12-04: 650 mg via ORAL

## 2019-12-04 NOTE — ED Triage Notes (Signed)
See note from UC. Pt referred by UC for fever, R flank pain, chills. Recently completed course of macrobid for UTI. Temp 100.2 at UC, pt given tylenol approx 30 min PTA. KUB showing kidney stones, hx of same. Blood and urine done at [facility] today.

## 2019-12-04 NOTE — ED Provider Notes (Signed)
[place], [place]   Name: [name] DOB: [birth year] MRN: [id number] CSN: [id number] PCP: [name], MD  Arrival date and time:  12/04/19 0846  Chief Complaint:  Flank Pain (right)   NOTE: Prior to seeing the patient today, I have reviewed the triage nursing documentation and vital signs. Clinical staff has updated patient's PMH/PSHx, current medication list, and drug allergies/intolerances to ensure comprehensive history available to assist in medical decision making.   History:   HPI: Dawn Farrell is a 64 y.o. female who presents today with complaints of RIGHT lower back pain with (+) radiation into her flank that began last night. Patient voiding normally. She has not appreciated any gross hematuria. Patient with subjective fever last night; Tmax unknown. Rigors persist. Last antipyretic dose (APAP) was at 2000 last night. Patient reports that she had chills and sweating that was "the worst ever". Patient denies dysuria. She experienced concurrent nausea and a single emesis episode. Patient has a generalized headache and has has xerostomia. Patient recently treated with a 7 day course of nitrofurantoin for a urinary tract infection. PMH (+) for urolithiasis; feels similar. Of note, patient is a Marine scientist. She reports that they are frequently testing for SARS-CoV-2 (novel coronavirus) at work; last tested negative of 12/03/2019. Patient denies any concurrent respiratory symptoms today in clinic.   Past Medical History:  Diagnosis Date  . Cancer ([place])    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  . COPD (chronic obstructive pulmonary disease) ([place])   . Diabetes mellitus without complication ([place])   . Renal calculi     Past Surgical History:  Procedure Laterality Date  . ABDOMINAL HYSTERECTOMY    . CESAREAN SECTION    . CHOLECYSTECTOMY    . COLONOSCOPY WITH PROPOFOL N/A 01/15/2018   Procedure: COLONOSCOPY WITH PROPOFOL;  Surgeon: Toledo, [name], MD;  Location: [facility] ENDOSCOPY;  Service:  Gastroenterology;  Laterality: N/A;  . HERNIA REPAIR    . Oral surgery - all teeth removed      Family History  Problem Relation Age of Onset  . Heart failure Mother   . Heart failure Father   . Breast cancer Sister 40    Social History   Tobacco Use  . Smoking status: Current Every Day Smoker    Packs/day: 0.25  . Smokeless tobacco: Never Used  Substance Use Topics  . Alcohol use: No  . Drug use: No    Patient Active Problem List   Diagnosis Date Noted  . Elevated rheumatoid factor 01/20/2017  . Left wrist pain 01/20/2017  . Leukocytosis 01/09/2017  . Thrombocytopenia ([place]) 01/09/2017    Home Medications:    Current Meds  Medication Sig  . aspirin EC 81 MG tablet Take 81 mg by mouth at bedtime.  [name] atorvastatin (LIPITOR) 40 MG tablet Take 40 mg by mouth at bedtime.   . enalapril (VASOTEC) 2.5 MG tablet Take 2.5 mg by mouth at bedtime.   . gabapentin (NEURONTIN) 300 MG capsule Take 300 mg by mouth at bedtime.  [name] JARDIANCE 25 MG TABS tablet 25 mg.  . Magnesium Oxide 200 MG TABS Take 200 mg by mouth 2 (two) times daily.  . metFORMIN (GLUCOPHAGE-XR) 500 MG 24 hr tablet Take 1,500 mg by mouth every evening.   . metoprolol succinate (TOPROL-XL) 25 MG 24 hr tablet Take 25 mg by mouth at bedtime.    Allergies:   Latex  Review of Systems (ROS): Review of Systems  Constitutional: Positive for chills, diaphoresis and fever.  HENT:  Negative for congestion, rhinorrhea, sinus pain and sore throat.   Respiratory: Negative for cough and shortness of breath.   Cardiovascular: Negative for chest pain and palpitations.  Gastrointestinal: Positive for nausea and vomiting. Negative for abdominal pain.  Genitourinary: Positive for flank pain. Negative for decreased urine volume, dysuria, frequency, hematuria, pelvic pain, urgency, vaginal bleeding, vaginal discharge and vaginal pain.  Musculoskeletal: Positive for back pain.  Skin: Negative for color change, pallor and rash.    Neurological: Negative for dizziness, syncope, weakness and headaches.  All other systems reviewed and are negative.    Vital Signs: Today's Vitals   12/04/19 0912 12/04/19 0916 12/04/19 1011 12/04/19 1047  BP:  100/81    Pulse:  94    Resp:  14    Temp:  98.5 F (36.9 C) 100.2 F (37.9 C)   TempSrc:  Oral Oral   SpO2:  100%    Weight: 161 lb (73 kg)     Height: 5\' 3"  (1.6 m)     PainSc: 2    2     Physical Exam: Physical Exam  Constitutional: She is oriented to person, place, and time and well-developed, well-nourished, and in no distress.  Acutely ill appearing; non-toxic.  HENT:  Head: Normocephalic and atraumatic.  Eyes: Pupils are equal, round, and reactive to light.  Cardiovascular: Normal rate, regular rhythm, normal heart sounds and intact distal pulses.  Pulmonary/Chest: Effort normal and breath sounds normal.  Abdominal: Soft. Bowel sounds are normal. She exhibits no distension. There is no abdominal tenderness. There is CVA tenderness.  Neurological: She is alert and oriented to person, place, and time. Gait normal.  Skin: Skin is warm and dry. No rash noted. She is not diaphoretic.  Psychiatric: Mood, memory, affect and judgment normal.  Nursing note and vitals reviewed.   Urgent Care Treatments / Results:   Orders Placed This Encounter  Procedures  . Urine culture  . DG Abdomen 1 View  . Urinalysis, Complete w Microscopic  . Basic metabolic panel  . CBC with Differential  . Recheck vitals    LABS: PLEASE NOTE: all labs that were ordered this encounter are listed, however only abnormal results are displayed. Labs Reviewed  URINALYSIS, COMPLETE (UACMP) WITH MICROSCOPIC - Abnormal; Notable for the following components:      Result Value   Color, Urine AMBER (*)    APPearance HAZY (*)    Glucose, UA >1,000 (*)    Hgb urine dipstick SMALL (*)    Protein, ur 30 (*)    Bacteria, UA MANY (*)    All other components within normal limits  BASIC  METABOLIC PANEL - Abnormal; Notable for the following components:   Sodium 133 (*)    All other components within normal limits  CBC WITH DIFFERENTIAL/PLATELET - Abnormal; Notable for the following components:   WBC 18.6 (*)    Platelets 137 (*)    Neutro Abs 16.6 (*)    Abs Immature Granulocytes 0.11 (*)    All other components within normal limits  URINE CULTURE    EKG: -None  RADIOLOGY: DG Abdomen 1 View  Result Date: 12/04/2019 CLINICAL DATA:  Acute right flank pain. EXAM: ABDOMEN - 1 VIEW COMPARISON:  Mar 05, 2013. June 22, 2016. FINDINGS: The bowel gas pattern is normal. Stable nephrolithiasis is seen involving upper pole right kidney. Status post cholecystectomy. IMPRESSION: Stable right nephrolithiasis. No evidence of bowel obstruction or ileus. Electronically Signed   By: Bobbe Medico.D.  On: 12/04/2019 09:51    PROCEDURES: Procedures  MEDICATIONS RECEIVED THIS VISIT: Medications  acetaminophen (TYLENOL) tablet 650 mg (650 mg Oral Given 12/04/19 1020)    PERTINENT CLINICAL COURSE NOTES/UPDATES: Clinical Course as of Dec 03 1101  Fri Dec 04, 2019  1018 Chills/rigors worsening. Order placed for repeat VS. Patient now febrile at 100.2. APAP 650 mg dose ordered. Awaiting lab results. Anticipating need for transfer to ED for further evaluation and treatment.    [BG]    Clinical Course User Index [BG] Karen Kitchens, NP   Initial Impression / Assessment and Plan / Urgent Care Course:  Pertinent labs & imaging results that were available during my care of the patient were personally reviewed by me and considered in my medical decision making (see lab/imaging section of note for values and interpretations).  CHANICE SNOKE is a 64 y.o. female who presents to [facility] today with complaints of Flank Pain (right)  Patient acutely ill appearing (non-toxic) appearing in clinic today. She does not appear to be in any acute distress. She does not appear to be in  any acute distress. Presenting symptoms (see HPI) and exam as documented above. Symptoms started last night. (+) fever, nausea, vomiting, and diaphoresis. Patient afebrile upon arrival, however during her time here, she was noted to be having worsening chills/rigors. Temperature reassessed and patient found to be febrile at 100.2. She was given a 650 mg dose of APAP.  Will pursue further evaluation and treatment in clinic as follows:   UA shows 11-20 WBC/hpf, 6-10 RBC/hpf, many bacteria; no nitrites or LE. Reflex culture sent.    WBC of 18.6 (ANC 16.6). Hemoglobin 13.6, hematocrit 41.1, MCV 85.2, and platelets 137.    Na 133, K+ 4.6, glucose 89, BUN 22, and creatinine 0.94 mg/dL. Estimated Creatinine Clearance: 58.6 mL/min (by C-G formula based on SCr of 0.94 mg/dL).   KUB reveals a normal bowel gas pattern; no evidence of SBO. There are stable nephrolithiasis noted on the RIGHT.   Reviewed results with patient. Discussed concerns and DDx, which includes pyelonephritis vs.septic urolithiasis. Patient's presenting symptoms today are felt to require further evaluation and possible intervention in a setting that is capable of providing a higher level of care. Patient is going to need further imaging (CT), IV fluids, IV medications (ABX and pain), and possible admission based on the findings of the aforementioned. I discussed with patient that her problems today are outside of the capabilities of this outpatient urgent care setting, thus my recommendations are to have her seen in the emergency department at either [facility] or [facility]. After discussion, patient notes that she would like to be seen at [facility]. Patient advising that she will present to the emergency department via POV for further evaluation as recommended.   Patient report called to [facility] emergency department staff; spoke with Rosana Hoes, RN. Nurse was advised of patient's presenting complaints, assessment in clinic, findings from work up thus  far, and plans for patient to present there for ongoing evaluation and management of her symptoms. Questions fielded. Nurse advised to return a call to [facility] with any questions or concerns pertaining to the care that Reginold Agent received here today. Hospital staff aware that patient will be presenting to their facility via POV today.   Final Clinical Impressions / Urgent Care Diagnoses:   Final diagnoses:  Flank pain  Leukocytosis, unspecified type  Fever, unspecified fever cause    New Prescriptions:  [place] Controlled Substance  Registry consulted? Not Applicable  Meds ordered this encounter  Medications  . acetaminophen (TYLENOL) tablet 650 mg    Recommended Follow up Care:   Follow-up Information    [facility] EMERGENCY DEPARTMENT.   Specialty: Emergency Medicine Why: Leave urgent care and proceed to [facility] ED for further evalaution and treatment. Contact information: [place] [id number] [address] [facility] [phone]        NOTE: This note was prepared using Dragon dictation software along with smaller phrase technology. Despite my best ability to proofread, there is the potential that transcriptional errors may still occur from this process, and are completely unintentional.    Karen Kitchens, NP 12/05/19 [phone]

## 2019-12-04 NOTE — ED Provider Notes (Addendum)
[facility] Emergency Department Provider Note    First MD Initiated Contact with Patient 12/04/19 1212     (approximate)  I have reviewed the triage vital signs and the nursing notes.   HISTORY  Chief Complaint Flank Pain and Fever    HPI Dawn Farrell is a 64 y.o. female presents to the ER for evaluation of right flank pain right lower quadrant pain associated with fever nausea.  Was recently put on Macrobid for cystitis but felt like her symptoms have progressively worsened.  Does have a history of kidney stones.  Was seen in urgent care today found to be febrile given her history of.  Was sent to the ER for further evaluation due to concern for sepsis.  She arrives febrile mildly tachycardic but well perfused and fairly well clinically.      Past Medical History:  Diagnosis Date  . Cancer ([place])    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  . COPD (chronic obstructive pulmonary disease) ([place])   . Diabetes mellitus without complication ([place])   . Renal calculi    Family History  Problem Relation Age of Onset  . Heart failure Mother   . Heart failure Father   . Breast cancer Sister 66   Past Surgical History:  Procedure Laterality Date  . ABDOMINAL HYSTERECTOMY    . CESAREAN SECTION    . CHOLECYSTECTOMY    . COLONOSCOPY WITH PROPOFOL N/A 01/15/2018   Procedure: COLONOSCOPY WITH PROPOFOL;  Surgeon: Toledo, [name], MD;  Location: [facility] ENDOSCOPY;  Service: Gastroenterology;  Laterality: N/A;  . HERNIA REPAIR    . Oral surgery - all teeth removed     Patient Active Problem List   Diagnosis Date Noted  . Elevated rheumatoid factor 01/20/2017  . Left wrist pain 01/20/2017  . Leukocytosis 01/09/2017  . Thrombocytopenia ([place]) 01/09/2017      Prior to Admission medications   Medication Sig Start Date End Date Taking? Authorizing Provider  aspirin EC 81 MG tablet Take 81 mg by mouth at bedtime.    [provider]  atorvastatin (LIPITOR) 40 MG tablet Take 40  mg by mouth at bedtime.     [provider]  cephALEXin (KEFLEX) 500 MG capsule Take 1 capsule (500 mg total) by mouth 3 (three) times daily for 7 days. 12/04/19 12/11/19  Merlyn Lot, MD  enalapril (VASOTEC) 2.5 MG tablet Take 2.5 mg by mouth at bedtime.     [provider]  gabapentin (NEURONTIN) 300 MG capsule Take 300 mg by mouth at bedtime. 12/26/16   [provider]  JARDIANCE 25 MG TABS tablet 25 mg. 12/27/16   [provider]  LANTUS SOLOSTAR 100 UNIT/ML Solostar Pen  11/11/19   [provider]  Magnesium Oxide 200 MG TABS Take 200 mg by mouth 2 (two) times daily.    [provider]  metFORMIN (GLUCOPHAGE-XR) 500 MG 24 hr tablet Take 1,500 mg by mouth every evening.     [provider]  metoprolol succinate (TOPROL-XL) 25 MG 24 hr tablet Take 25 mg by mouth at bedtime.    [provider]  ondansetron (ZOFRAN) 4 MG tablet Take 1 tablet (4 mg total) by mouth daily as needed. 12/04/19 12/03/20  Merlyn Lot, MD  OZEMPIC, 0.25 OR 0.5 MG/DOSE, 2 MG/1.5ML [facility]  11/17/19   [provider]  albuterol (PROVENTIL HFA;VENTOLIN HFA) 108 (90 Base) MCG/ACT inhaler Inhale 1-2 puffs into the lungs every 6 (six) hours as needed for wheezing  or shortness of breath. 08/30/18 12/04/19  Norval Gable, MD  Insulin Detemir (LEVEMIR FLEXPEN) 100 UNIT/ML Pen Inject 50 Units into the skin at bedtime.   12/04/19  [provider]    Allergies Latex    Social History Social History   Tobacco Use  . Smoking status: Current Every Day Smoker    Packs/day: 0.25  . Smokeless tobacco: Never Used  Substance Use Topics  . Alcohol use: No  . Drug use: No    Review of Systems Patient denies headaches, rhinorrhea, blurry vision, numbness, shortness of breath, chest pain, edema, cough, abdominal pain, nausea, vomiting, diarrhea, dysuria, fevers, rashes or hallucinations unless otherwise stated above in  HPI. ____________________________________________   PHYSICAL EXAM:  VITAL SIGNS: Vitals:   12/04/19 1515 12/04/19 1530  BP:  136/68  Pulse:  97  Resp:  18  Temp: 98.9 F (37.2 C)   SpO2:  98%    Constitutional: Alert and oriented.  Eyes: Conjunctivae are normal.  Head: Atraumatic. Nose: No congestion/rhinnorhea. Mouth/Throat: Mucous membranes are moist.   Neck: No stridor. Painless ROM.  Cardiovascular: Normal rate, regular rhythm. Grossly normal heart sounds.  Good peripheral circulation. Respiratory: Normal respiratory effort.  No retractions. Lungs CTAB. Gastrointestinal: Soft and nontender. No distention. No abdominal bruits.+ right CVA tenderness. Genitourinary:  Musculoskeletal: No lower extremity tenderness nor edema.  No joint effusions. Neurologic:  Normal speech and language. No gross focal neurologic deficits are appreciated. No facial droop Skin:  Skin is warm, dry and intact. No rash noted. Psychiatric: Mood and affect are normal. Speech and behavior are normal.  ____________________________________________   LABS (all labs ordered are listed, but only abnormal results are displayed)  Results for orders placed or performed during the hospital encounter of 12/04/19 (from the past 24 hour(s))  Lactic acid, plasma     Status: None   Collection Time: 12/04/19 12:36 PM  Result Value Ref Range   Lactic Acid, Venous 0.8 0.5 - 1.9 mmol/L  Comprehensive metabolic panel     Status: Abnormal   Collection Time: 12/04/19 12:36 PM  Result Value Ref Range   Sodium 133 (L) 135 - 145 mmol/L   Potassium 3.9 3.5 - 5.1 mmol/L   Chloride 101 98 - 111 mmol/L   CO2 23 22 - 32 mmol/L   Glucose, Bld 76 70 - 99 mg/dL   BUN 23 8 - 23 mg/dL   Creatinine, Ser 1.00 0.44 - 1.00 mg/dL   Calcium 9.1 8.9 - 10.3 mg/dL   Total Protein 7.3 6.5 - 8.1 g/dL   Albumin 4.2 3.5 - 5.0 g/dL   AST 17 15 - 41 U/L   ALT 18 0 - 44 U/L   Alkaline Phosphatase 80 38 - 126 U/L   Total Bilirubin 1.1  0.3 - 1.2 mg/dL   GFR calc non Af Amer 60 (L) >60 mL/min   GFR calc Af Amer >60 >60 mL/min   Anion gap 9 5 - 15  CBC WITH DIFFERENTIAL     Status: Abnormal   Collection Time: 12/04/19 12:36 PM  Result Value Ref Range   WBC 15.5 (H) 4.0 - 10.5 K/uL   RBC 4.42 3.87 - 5.11 MIL/uL   Hemoglobin 12.5 12.0 - 15.0 g/dL   HCT 38.0 36.0 - 46.0 %   MCV 86.0 80.0 - 100.0 fL   MCH 28.3 26.0 - 34.0 pg   MCHC 32.9 30.0 - 36.0 g/dL   RDW 13.2 11.5 - 15.5 %   Platelets 124 (L)  150 - 400 K/uL   nRBC 0.0 0.0 - 0.2 %   Neutrophils Relative % 88 %   Neutro Abs 13.6 (H) 1.7 - 7.7 K/uL   Lymphocytes Relative 6 %   Lymphs Abs 1.0 0.7 - 4.0 K/uL   Monocytes Relative 5 %   Monocytes Absolute 0.8 0.1 - 1.0 K/uL   Eosinophils Relative 0 %   Eosinophils Absolute 0.0 0.0 - 0.5 K/uL   Basophils Relative 0 %   Basophils Absolute 0.1 0.0 - 0.1 K/uL   Immature Granulocytes 1 %   Abs Immature Granulocytes 0.08 (H) 0.00 - 0.07 K/uL  Urinalysis, Complete w Microscopic     Status: Abnormal   Collection Time: 12/04/19  3:13 PM  Result Value Ref Range   Color, Urine YELLOW (A) YELLOW   APPearance CLEAR (A) CLEAR   Specific Gravity, Urine >1.046 (H) 1.005 - 1.030   pH 5.0 5.0 - 8.0   Glucose, UA >=500 (A) NEGATIVE mg/dL   Hgb urine dipstick SMALL (A) NEGATIVE   Bilirubin Urine NEGATIVE NEGATIVE   Ketones, ur NEGATIVE NEGATIVE mg/dL   Protein, ur 30 (A) NEGATIVE mg/dL   Nitrite NEGATIVE NEGATIVE   Leukocytes,Ua NEGATIVE NEGATIVE   RBC / HPF 11-20 0 - 5 RBC/hpf   WBC, UA 11-20 0 - 5 WBC/hpf   Bacteria, UA NONE SEEN NONE SEEN   Squamous Epithelial / LPF 0-5 0 - 5   Mucus PRESENT    ____________________________________________  EKG My review and personal interpretation at Time: 13:13   Indication: tachycardia  Rate: 88  Rhythm: sinus Axis: normal Other: normal intervals, no stemi ____________________________________________  RADIOLOGY  I personally reviewed all radiographic images ordered to evaluate  for the above acute complaints and reviewed radiology reports and findings.  These findings were personally discussed with the patient.  Please see medical record for radiology report.  ____________________________________________   PROCEDURES  Procedure(s) performed:  Procedures    Critical Care performed: no ____________________________________________   INITIAL IMPRESSION / ASSESSMENT AND PLAN / ED COURSE  Pertinent labs & imaging results that were available during my care of the patient were reviewed by me and considered in my medical decision making (see chart for details).   DDX: pyelo, stone, cystitis, sepsis, bacteremia, appendicitis  Dawn Farrell is a 64 y.o. who presents to the ED with symptoms as described above.  Febrile mildly tachycardic but well perfused.  She is fairly well-appearing clinically but her presentation is complicated by recent antibiotic use and comorbidities.  Will give IV fluids obtain blood work and start IV antibiotics his blood work and urinalysis at urgent care shows evidence of cystitis and I suspect she has pyelonephritis but given her history will order CT imaging to evaluate for the above differential.  Review of records does show that her urine culture from the end of January did grow out Klebsiella that was sensitive to cefazolin.  Clinical Course as of Dec 04 1535  Fri Dec 04, 2019  1416 Patient reassessed.  Does have leukocytosis but her lactate is normal.  CT imaging does not show any evidence of stone shows a complicated pyelonephritis.  She is currently pain-free.  I discussed with the patient my recommendation for hospitalization for IV antibiotics and IV fluids.  She states that she will decline admission and will trial outpatient management.  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  She feels well, she is not tachycardic and is tolerating  p.o.  Will observe here in the ER little bit  longer ensure that she is tolerating p.o. and reassess.   [PR]  1518 Patient reassessed.  Again I recommended hospitalization given her recent antibiotic use above patient tolerating p.o.  Her heart rate is stable.  Vital signs remained stable.   She did have 1 documented low O2 saturation but the patient is satting in the high 90s on room air.  She does not feel short of breath.  It was likely an erroneous reading.  she can tolerate oral antibiotics and has had these symptoms in the past which have been managed at home.  Encouraged patient to return to the ED if she changes her mind about admission.   [PR]    Clinical Course User Index [PR] Merlyn Lot, MD    The patient was evaluated in Emergency Department today for the symptoms described in the history of present illness. He/she was evaluated in the context of the global COVID-19 pandemic, which necessitated consideration that the patient might be at risk for infection with the SARS-CoV-2 virus that causes COVID-19. Institutional protocols and algorithms that pertain to the evaluation of patients at risk for COVID-19 are in a state of rapid change based on information released by regulatory bodies including the CDC and federal and state organizations. These policies and algorithms were followed during the patient's care in the ED.  As part of my medical decision making, I reviewed the following data within the [facility] notes reviewed and incorporated, Labs reviewed, notes from prior ED visits and [place] Controlled Substance Database   ____________________________________________   FINAL CLINICAL IMPRESSION(S) / ED DIAGNOSES  Final diagnoses:  Pyelonephritis      NEW MEDICATIONS STARTED DURING THIS VISIT:  New Prescriptions   CEPHALEXIN (KEFLEX) 500 MG CAPSULE    Take 1 capsule (500 mg total) by mouth 3 (three) times daily for 7 days.   ONDANSETRON (ZOFRAN) 4 MG TABLET    Take 1 tablet (4 mg total) by  mouth daily as needed.     Note:  This document was prepared using Dragon voice recognition software and may include unintentional dictation errors.    Merlyn Lot, MD 12/04/19 [place]    Merlyn Lot, MD 12/04/19 1537

## 2019-12-04 NOTE — ED Notes (Signed)
Pt transported to CT and x-ray  

## 2019-12-04 NOTE — ED Triage Notes (Addendum)
Patient complains of right side flank pain that started yesterday. States that she has some nausea. Reports that she has a history of kidney stones. States that pain has improved some now. Patient states that she finished an antibiotic (macrobid) for a UTI last week.

## 2019-12-04 NOTE — Discharge Instructions (Signed)
Go to the ER.  Honor Loh, MSN, APRN, FNP-C, CEN Advanced Practice Provider [facility] 12/04/2019 10:33 AM

## 2019-12-05 ENCOUNTER — Telehealth: Payer: Self-pay | Admitting: Emergency Medicine

## 2019-12-05 LAB — BLOOD CULTURE ID PANEL (REFLEXED)

## 2019-12-06 LAB — URINE CULTURE

## 2019-12-07 ENCOUNTER — Other Ambulatory Visit: Payer: Self-pay

## 2019-12-07 ENCOUNTER — Encounter: Payer: Self-pay | Admitting: Emergency Medicine

## 2019-12-07 ENCOUNTER — Inpatient Hospital Stay
Admission: EM | Admit: 2019-12-07 | Discharge: 2019-12-09 | DRG: 690 | Disposition: A | Discharge status: Home / Self Care | Payer: 59 | Attending: Internal Medicine | Admitting: Internal Medicine

## 2019-12-07 ENCOUNTER — Telehealth: Payer: Self-pay | Admitting: Pharmacist

## 2019-12-07 ENCOUNTER — Emergency Department: Payer: 59

## 2019-12-07 DIAGNOSIS — Z720 Tobacco use: Secondary | ICD-10-CM | POA: Diagnosis present

## 2019-12-07 DIAGNOSIS — E1169 Type 2 diabetes mellitus with other specified complication: Secondary | ICD-10-CM | POA: Diagnosis not present

## 2019-12-07 DIAGNOSIS — A498 Other bacterial infections of unspecified site: Secondary | ICD-10-CM | POA: Diagnosis not present

## 2019-12-07 DIAGNOSIS — E785 Hyperlipidemia, unspecified: Secondary | ICD-10-CM | POA: Diagnosis present

## 2019-12-07 DIAGNOSIS — B9689 Other specified bacterial agents as the cause of diseases classified elsewhere: Secondary | ICD-10-CM | POA: Diagnosis present

## 2019-12-07 DIAGNOSIS — I471 Supraventricular tachycardia: Secondary | ICD-10-CM | POA: Diagnosis not present

## 2019-12-07 DIAGNOSIS — I10 Essential (primary) hypertension: Secondary | ICD-10-CM | POA: Diagnosis present

## 2019-12-07 DIAGNOSIS — Z85828 Personal history of other malignant neoplasm of skin: Secondary | ICD-10-CM

## 2019-12-07 DIAGNOSIS — N200 Calculus of kidney: Secondary | ICD-10-CM | POA: Diagnosis present

## 2019-12-07 DIAGNOSIS — N10 Acute tubulo-interstitial nephritis: Secondary | ICD-10-CM | POA: Diagnosis present

## 2019-12-07 DIAGNOSIS — Z87440 Personal history of urinary (tract) infections: Secondary | ICD-10-CM

## 2019-12-07 DIAGNOSIS — R7881 Bacteremia: Secondary | ICD-10-CM | POA: Diagnosis present

## 2019-12-07 DIAGNOSIS — N12 Tubulo-interstitial nephritis, not specified as acute or chronic: Secondary | ICD-10-CM

## 2019-12-07 DIAGNOSIS — D696 Thrombocytopenia, unspecified: Secondary | ICD-10-CM | POA: Diagnosis present

## 2019-12-07 DIAGNOSIS — Z794 Long term (current) use of insulin: Secondary | ICD-10-CM | POA: Diagnosis not present

## 2019-12-07 DIAGNOSIS — E119 Type 2 diabetes mellitus without complications: Secondary | ICD-10-CM

## 2019-12-07 DIAGNOSIS — Z91040 Latex allergy status: Secondary | ICD-10-CM | POA: Diagnosis not present

## 2019-12-07 DIAGNOSIS — Z7982 Long term (current) use of aspirin: Secondary | ICD-10-CM

## 2019-12-07 DIAGNOSIS — Z87442 Personal history of urinary calculi: Secondary | ICD-10-CM | POA: Diagnosis not present

## 2019-12-07 DIAGNOSIS — F17210 Nicotine dependence, cigarettes, uncomplicated: Secondary | ICD-10-CM | POA: Diagnosis present

## 2019-12-07 DIAGNOSIS — J449 Chronic obstructive pulmonary disease, unspecified: Secondary | ICD-10-CM | POA: Diagnosis present

## 2019-12-07 DIAGNOSIS — Z20822 Contact with and (suspected) exposure to covid-19: Secondary | ICD-10-CM | POA: Diagnosis present

## 2019-12-07 DIAGNOSIS — Z79899 Other long term (current) drug therapy: Secondary | ICD-10-CM | POA: Diagnosis not present

## 2019-12-07 DIAGNOSIS — E118 Type 2 diabetes mellitus with unspecified complications: Secondary | ICD-10-CM | POA: Diagnosis not present

## 2019-12-07 HISTORY — DX: Essential (primary) hypertension: I10

## 2019-12-07 LAB — COMPREHENSIVE METABOLIC PANEL
ALT: 49 U/L — ABNORMAL HIGH (ref 0–44)
AST: 39 U/L (ref 15–41)
Albumin: 3.6 g/dL (ref 3.5–5.0)
Alkaline Phosphatase: 73 U/L (ref 38–126)
Anion gap: 10 (ref 5–15)
BUN: 15 mg/dL (ref 8–23)
CO2: 24 mmol/L (ref 22–32)
Calcium: 8.6 mg/dL — ABNORMAL LOW (ref 8.9–10.3)
Chloride: 102 mmol/L (ref 98–111)
Creatinine, Ser: 0.8 mg/dL (ref 0.44–1.00)
GFR calc Af Amer: >60 mL/min (ref >60)
GFR calc non Af Amer: >60 mL/min (ref >60)
Glucose, Bld: 89 mg/dL (ref 70–99)
Potassium: 3.8 mmol/L (ref 3.5–5.1)
Sodium: 136 mmol/L (ref 135–145)
Total Bilirubin: 0.7 mg/dL (ref 0.3–1.2)
Total Protein: 7.2 g/dL (ref 6.5–8.1)

## 2019-12-07 LAB — CULTURE, BLOOD (ROUTINE X 2)
Special Requests: ADEQUATE
Special Requests: ADEQUATE

## 2019-12-07 LAB — RESPIRATORY PANEL BY RT PCR (FLU A&B, COVID)
Influenza A by PCR: NEGATIVE
Influenza B by PCR: NEGATIVE
SARS Coronavirus 2 by RT PCR: NEGATIVE

## 2019-12-07 LAB — CBC WITH DIFFERENTIAL/PLATELET
Abs Immature Granulocytes: 0.02 10*3/uL (ref 0.00–0.07)
Basophils Absolute: 0 10*3/uL (ref 0.0–0.1)
Basophils Relative: 1 %
Eosinophils Absolute: 0 10*3/uL (ref 0.0–0.5)
Eosinophils Relative: 1 %
HCT: 36.9 % (ref 36.0–46.0)
Hemoglobin: 12.4 g/dL (ref 12.0–15.0)
Immature Granulocytes: 0 %
Lymphocytes Relative: 14 %
Lymphs Abs: 0.8 10*3/uL (ref 0.7–4.0)
MCH: 28.2 pg (ref 26.0–34.0)
MCHC: 33.6 g/dL (ref 30.0–36.0)
MCV: 84.1 fL (ref 80.0–100.0)
Monocytes Absolute: 0.6 10*3/uL (ref 0.1–1.0)
Monocytes Relative: 9 %
Neutro Abs: 4.5 10*3/uL (ref 1.7–7.7)
Neutrophils Relative %: 75 %
Platelets: 123 10*3/uL — ABNORMAL LOW (ref 150–400)
RBC: 4.39 MIL/uL (ref 3.87–5.11)
RDW: 13.1 % (ref 11.5–15.5)
WBC: 6 10*3/uL (ref 4.0–10.5)
nRBC: 0 % (ref 0.0–0.2)

## 2019-12-07 LAB — URINE CULTURE: Culture: 80000 — AB

## 2019-12-07 LAB — PROTIME-INR
INR: 1 (ref 0.8–1.2)
Prothrombin Time: 13.3 seconds (ref 11.4–15.2)

## 2019-12-07 LAB — GLUCOSE, CAPILLARY
Glucose-Capillary: 58 mg/dL — ABNORMAL LOW (ref 70–99)
Glucose-Capillary: 90 mg/dL (ref 70–99)

## 2019-12-07 LAB — LACTIC ACID, PLASMA: Lactic Acid, Venous: 1 mmol/L (ref 0.5–1.9)

## 2019-12-07 MED ORDER — METOPROLOL SUCCINATE ER 25 MG PO TB24
25.0000 mg | ORAL_TABLET | Freq: Every day | ORAL | Status: DC
  Administered 2019-12-08 (×2): 25 mg via ORAL
  Filled 2019-12-07 (×2): qty 1

## 2019-12-07 MED ORDER — ATORVASTATIN CALCIUM 20 MG PO TABS
40.0000 mg | ORAL_TABLET | Freq: Every day | ORAL | Status: DC
  Filled 2019-12-07: qty 2

## 2019-12-07 MED ORDER — INSULIN ASPART 100 UNIT/ML ~~LOC~~ SOLN
0.0000 [IU] | SUBCUTANEOUS | Status: DC
  Administered 2019-12-08 (×2): 1 [IU] via SUBCUTANEOUS
  Filled 2019-12-07 (×2): qty 1

## 2019-12-07 MED ORDER — ENALAPRIL MALEATE 2.5 MG PO TABS
2.5000 mg | ORAL_TABLET | Freq: Every day | ORAL | Status: DC
  Administered 2019-12-08: 22:00:00 2.5 mg via ORAL
  Filled 2019-12-07 (×2): qty 1

## 2019-12-07 MED ORDER — GABAPENTIN 300 MG PO CAPS
300.0000 mg | ORAL_CAPSULE | Freq: Every day | ORAL | Status: DC
  Administered 2019-12-08 (×2): 300 mg via ORAL
  Filled 2019-12-07 (×2): qty 1

## 2019-12-07 MED ORDER — ONDANSETRON HCL 4 MG/2ML IJ SOLN: 4.0000 mg | Freq: Three times a day (TID) | INTRAMUSCULAR | Status: DC | PRN

## 2019-12-07 MED ORDER — INSULIN GLARGINE 100 UNIT/ML ~~LOC~~ SOLN
20.0000 [IU] | Freq: Every day | SUBCUTANEOUS | Status: DC
  Administered 2019-12-08: 22:00:00 20 [IU] via SUBCUTANEOUS
  Filled 2019-12-07 (×2): qty 0.2

## 2019-12-07 MED ORDER — HYDRALAZINE HCL 25 MG PO TABS: 25.0000 mg | ORAL_TABLET | Freq: Three times a day (TID) | ORAL | Status: DC | PRN

## 2019-12-07 MED ORDER — DM-GUAIFENESIN ER 30-600 MG PO TB12
1.0000 | ORAL_TABLET | Freq: Two times a day (BID) | ORAL | Status: DC
  Administered 2019-12-08 – 2019-12-09 (×2): 1 via ORAL
  Filled 2019-12-07 (×3): qty 1

## 2019-12-07 MED ORDER — ACETAMINOPHEN 325 MG PO TABS
650.0000 mg | ORAL_TABLET | Freq: Four times a day (QID) | ORAL | Status: DC | PRN
  Administered 2019-12-08 – 2019-12-09 (×2): 650 mg via ORAL
  Filled 2019-12-07 (×2): qty 2

## 2019-12-07 MED ORDER — NICOTINE 21 MG/24HR TD PT24
21.0000 mg | MEDICATED_PATCH | Freq: Every day | TRANSDERMAL | Status: DC
  Administered 2019-12-07 – 2019-12-09 (×3): 21 mg via TRANSDERMAL
  Filled 2019-12-07 (×3): qty 1

## 2019-12-07 MED ORDER — MAGNESIUM OXIDE 400 (241.3 MG) MG PO TABS
200.0000 mg | ORAL_TABLET | Freq: Two times a day (BID) | ORAL | Status: DC
  Administered 2019-12-08 – 2019-12-09 (×3): 200 mg via ORAL
  Filled 2019-12-07 (×3): qty 1

## 2019-12-07 MED ORDER — HEPARIN SODIUM (PORCINE) 5000 UNIT/ML IJ SOLN
5000.0000 [IU] | Freq: Three times a day (TID) | INTRAMUSCULAR | Status: DC
  Administered 2019-12-08 – 2019-12-09 (×5): 5000 [IU] via SUBCUTANEOUS
  Filled 2019-12-07 (×5): qty 1

## 2019-12-07 MED ORDER — SODIUM CHLORIDE 0.9 % IV SOLN
1.0000 g | Freq: Three times a day (TID) | INTRAVENOUS | Status: DC
  Administered 2019-12-07: 14:00:00 1 g via INTRAVENOUS
  Filled 2019-12-07: qty 1

## 2019-12-07 MED ORDER — SODIUM CHLORIDE 0.9 % IV SOLN
2.0000 g | Freq: Three times a day (TID) | INTRAVENOUS | Status: DC
  Administered 2019-12-07 – 2019-12-09 (×5): 2 g via INTRAVENOUS
  Filled 2019-12-07 (×7): qty 2

## 2019-12-07 MED ORDER — SODIUM CHLORIDE 0.9 % IV SOLN: Freq: Once | INTRAVENOUS | Status: AC

## 2019-12-07 MED ORDER — ASPIRIN EC 81 MG PO TBEC
81.0000 mg | DELAYED_RELEASE_TABLET | Freq: Every day | ORAL | Status: DC
  Administered 2019-12-08 (×2): 81 mg via ORAL
  Filled 2019-12-07 (×2): qty 1

## 2019-12-07 MED ORDER — ALBUTEROL SULFATE HFA 108 (90 BASE) MCG/ACT IN AERS
2.0000 | INHALATION_SPRAY | RESPIRATORY_TRACT | Status: DC | PRN
  Filled 2019-12-07: qty 6.7

## 2019-12-07 NOTE — ED Provider Notes (Signed)
[facility] Emergency Department Provider Note    First MD Initiated Contact with Patient 12/07/19 1304     (approximate)  I have reviewed the triage vital signs and the nursing notes.   HISTORY  Chief Complaint Abnormal Lab    HPI Dawn Farrell is a 64 y.o. female with the below listed past medical history recently seen in this ER by me for evaluation of pyelonephritis represents the ER after blood cultures did grow out Serratia that was resistant cephalosporin.  Patient states that she felt better after initial evaluation.  I had recommended admission to the hospital but she felt well wanted to trial outpatient therapy.  States that started last night started redeveloping rigors at night.  Has had decreased p.o. intake.  Has been able to keep down the antibiotics.  Per pharmacy recommendation will require hospital utilization for IV cefepime.    Past Medical History:  Diagnosis Date  . Cancer ([place])    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  . COPD (chronic obstructive pulmonary disease) ([place])   . Diabetes mellitus without complication ([place])   . Renal calculi    Family History  Problem Relation Age of Onset  . Heart failure Mother   . Heart failure Father   . Breast cancer Sister 40   Past Surgical History:  Procedure Laterality Date  . ABDOMINAL HYSTERECTOMY    . CESAREAN SECTION    . CHOLECYSTECTOMY    . COLONOSCOPY WITH PROPOFOL N/A 01/15/2018   Procedure: COLONOSCOPY WITH PROPOFOL;  Surgeon: Toledo, [name], MD;  Location: [facility] ENDOSCOPY;  Service: Gastroenterology;  Laterality: N/A;  . HERNIA REPAIR    . Oral surgery - all teeth removed     Patient Active Problem List   Diagnosis Date Noted  . Elevated rheumatoid factor 01/20/2017  . Left wrist pain 01/20/2017  . Leukocytosis 01/09/2017  . Thrombocytopenia ([place]) 01/09/2017      Prior to Admission medications   Medication Sig Start Date End Date Taking? Authorizing Provider  aspirin EC 81 MG tablet  Take 81 mg by mouth at bedtime.    [provider]  atorvastatin (LIPITOR) 40 MG tablet Take 40 mg by mouth at bedtime.     [provider]  cephALEXin (KEFLEX) 500 MG capsule Take 1 capsule (500 mg total) by mouth 3 (three) times daily for 7 days. 12/04/19 12/11/19  Merlyn Lot, MD  enalapril (VASOTEC) 2.5 MG tablet Take 2.5 mg by mouth at bedtime.     [provider]  gabapentin (NEURONTIN) 300 MG capsule Take 300 mg by mouth at bedtime. 12/26/16   [provider]  JARDIANCE 25 MG TABS tablet 25 mg. 12/27/16   [provider]  LANTUS SOLOSTAR 100 UNIT/ML Solostar Pen  11/11/19   [provider]  Magnesium Oxide 200 MG TABS Take 200 mg by mouth 2 (two) times daily.    [provider]  metFORMIN (GLUCOPHAGE-XR) 500 MG 24 hr tablet Take 1,500 mg by mouth every evening.     [provider]  metoprolol succinate (TOPROL-XL) 25 MG 24 hr tablet Take 25 mg by mouth at bedtime.    [provider]  ondansetron (ZOFRAN) 4 MG tablet Take 1 tablet (4 mg total) by mouth daily as needed. 12/04/19 12/03/20  Merlyn Lot, MD  OZEMPIC, 0.25 OR 0.5 MG/DOSE, 2 MG/1.5ML [facility]  11/17/19   [provider]  albuterol (PROVENTIL HFA;VENTOLIN HFA) 108 (90 Base) MCG/ACT inhaler Inhale 1-2 puffs into the lungs  every 6 (six) hours as needed for wheezing or shortness of breath. 08/30/18 12/04/19  Norval Gable, MD  Insulin Detemir (LEVEMIR FLEXPEN) 100 UNIT/ML Pen Inject 50 Units into the skin at bedtime.   12/04/19  [provider]    Allergies Latex    Social History Social History   Tobacco Use  . Smoking status: Current Every Day Smoker    Packs/day: 0.25  . Smokeless tobacco: Never Used  Substance Use Topics  . Alcohol use: No  . Drug use: No    Review of Systems Patient denies headaches, rhinorrhea, blurry vision, numbness, shortness of breath, chest pain, edema, cough, abdominal pain, nausea, vomiting,  diarrhea, dysuria, fevers, rashes or hallucinations unless otherwise stated above in HPI. ____________________________________________   PHYSICAL EXAM:  VITAL SIGNS: Vitals:   12/07/19 1047 12/07/19 1312  BP: 139/73 (!) 138/92  Pulse: 86 77  Resp: 16 15  Temp: 98.3 F (36.8 C)   SpO2: 97% 97%    Constitutional: Alert and oriented.  Eyes: Conjunctivae are normal.  Head: Atraumatic. Nose: No congestion/rhinnorhea. Mouth/Throat: Mucous membranes are moist.   Neck: No stridor. Painless ROM.  Cardiovascular: Normal rate, regular rhythm. Grossly normal heart sounds.  Good peripheral circulation. Respiratory: Normal respiratory effort.  No retractions. Lungs CTAB. Gastrointestinal: Soft and nontender. No distention. No abdominal bruits. No CVA tenderness. Genitourinary:  Musculoskeletal: No lower extremity tenderness nor edema.  No joint effusions. Neurologic:  Normal speech and language. No gross focal neurologic deficits are appreciated. No facial droop Skin:  Skin is warm, dry and intact. No rash noted. Psychiatric: Mood and affect are normal. Speech and behavior are normal.  ____________________________________________   LABS (all labs ordered are listed, but only abnormal results are displayed)  Results for orders placed or performed during the hospital encounter of 12/07/19 (from the past 24 hour(s))  Comprehensive metabolic panel     Status: Abnormal   Collection Time: 12/07/19 11:03 AM  Result Value Ref Range   Sodium 136 135 - 145 mmol/L   Potassium 3.8 3.5 - 5.1 mmol/L   Chloride 102 98 - 111 mmol/L   CO2 24 22 - 32 mmol/L   Glucose, Bld 89 70 - 99 mg/dL   BUN 15 8 - 23 mg/dL   Creatinine, Ser 0.80 0.44 - 1.00 mg/dL   Calcium 8.6 (L) 8.9 - 10.3 mg/dL   Total Protein 7.2 6.5 - 8.1 g/dL   Albumin 3.6 3.5 - 5.0 g/dL   AST 39 15 - 41 U/L   ALT 49 (H) 0 - 44 U/L   Alkaline Phosphatase 73 38 - 126 U/L   Total Bilirubin 0.7 0.3 - 1.2 mg/dL   GFR calc non Af Amer >60  >60 mL/min   GFR calc Af Amer >60 >60 mL/min   Anion gap 10 5 - 15  Lactic acid, plasma     Status: None   Collection Time: 12/07/19 11:03 AM  Result Value Ref Range   Lactic Acid, Venous 1.0 0.5 - 1.9 mmol/L  CBC with Differential     Status: Abnormal   Collection Time: 12/07/19 11:03 AM  Result Value Ref Range   WBC 6.0 4.0 - 10.5 K/uL   RBC 4.39 3.87 - 5.11 MIL/uL   Hemoglobin 12.4 12.0 - 15.0 g/dL   HCT 36.9 36.0 - 46.0 %   MCV 84.1 80.0 - 100.0 fL   MCH 28.2 26.0 - 34.0 pg   MCHC 33.6 30.0 - 36.0 g/dL   RDW 13.1 11.5 -  15.5 %   Platelets 123 (L) 150 - 400 K/uL   nRBC 0.0 0.0 - 0.2 %   Neutrophils Relative % 75 %   Neutro Abs 4.5 1.7 - 7.7 K/uL   Lymphocytes Relative 14 %   Lymphs Abs 0.8 0.7 - 4.0 K/uL   Monocytes Relative 9 %   Monocytes Absolute 0.6 0.1 - 1.0 K/uL   Eosinophils Relative 1 %   Eosinophils Absolute 0.0 0.0 - 0.5 K/uL   Basophils Relative 1 %   Basophils Absolute 0.0 0.0 - 0.1 K/uL   Immature Granulocytes 0 %   Abs Immature Granulocytes 0.02 0.00 - 0.07 K/uL  Protime-INR     Status: None   Collection Time: 12/07/19 11:03 AM  Result Value Ref Range   Prothrombin Time 13.3 11.4 - 15.2 seconds   INR 1.0 0.8 - 1.2   ____________________________________________ ____________________________________________  RADIOLOGY   ____________________________________________   PROCEDURES  Procedure(s) performed:  Procedures    Critical Care performed: no ____________________________________________   INITIAL IMPRESSION / ASSESSMENT AND PLAN / ED COURSE  Pertinent labs & imaging results that were available during my care of the patient were reviewed by me and considered in my medical decision making (see chart for details).   DDX: pyelonephritis, bacteremia, sepsis, cystitis  Dawn Farrell is a 64 y.o. who presents to the ED with symptoms as described above.  Patient arrives afebrile hemodynamically stable.  Blood work is reassuring but blood cultures  did grow out Serratia as well as Enterobacter resistant to her oral antibiotics.  Given her persistent rigors symptoms pharmacy did recommend IV cefepime.  Will order IV antibiotics and discussed with hospitalist for admission.     The patient was evaluated in Emergency Department today for the symptoms described in the history of present illness. He/she was evaluated in the context of the global COVID-19 pandemic, which necessitated consideration that the patient might be at risk for infection with the SARS-CoV-2 virus that causes COVID-19. Institutional protocols and algorithms that pertain to the evaluation of patients at risk for COVID-19 are in a state of rapid change based on information released by regulatory bodies including the CDC and federal and state organizations. These policies and algorithms were followed during the patient's care in the ED.  As part of my medical decision making, I reviewed the following data within the [facility] notes reviewed and incorporated, Labs reviewed, notes from prior ED visits and [place] Controlled Substance Database   ____________________________________________   FINAL CLINICAL IMPRESSION(S) / ED DIAGNOSES  Final diagnoses:  Pyelonephritis      NEW MEDICATIONS STARTED DURING THIS VISIT:  New Prescriptions   No medications on file     Note:  This document was prepared using Dragon voice recognition software and may include unintentional dictation errors.    Merlyn Lot, MD 12/07/19 1314

## 2019-12-07 NOTE — ED Notes (Signed)
Pt called out feeling hot and flushed.  Checked blood sugar at was 58. Given sprite.  Will recheck.

## 2019-12-07 NOTE — ED Notes (Signed)
Waiting on meal tray. NAD. No needs. Lights dimmed and head of bed declined some for pt.

## 2019-12-07 NOTE — ED Notes (Signed)
Called back for + blood cx.  Pt report feeling better but has still woke up in night with sweats

## 2019-12-07 NOTE — ED Triage Notes (Signed)
Patient presents to the ED after being called by a pharmacist due to blood cultures coming back positive.  ED physician recommended patient come to the ED for admission.  Patient states she has been getting fevers at night, with diaphoresis during the night.  Patient was seen in the ED 3 days ago.

## 2019-12-07 NOTE — Progress Notes (Signed)
Pharmacy - Antimicrobial Stewardship  Notified of patient's 2/12 Blood cultures growing Serratia Marcesans, resistant to cefazolin.  She was discharged on cephalexin (as she did not want to be admitted on day of presentation).  Called patient to discuss blood cultures and she stated she had drenching sweats last night (but previous to that felt fevers were better).  She stated she was able to start taking so PO yesterday (soup, orange).  I discussed patient with Dr. Joni Fears and he recommended patient be seen and likely admitted for IV antibiotics.  Plan: - Called patient back and she is agreeable to being seen in ED.  She is aware to expect admission - Recommend starting her on cefepime since Serratia can be AMP-C producing bacteria and other cephalosporins (ex. Ceftriaxone, even if reported as susceptible) may not be as effective as resistant can develop with exposure.   Doreene Eland, PharmD, BCPS.   Work Cell: [phone] 12/07/2019 9:36 AM

## 2019-12-07 NOTE — H&P (Addendum)
History and Physical    Dawn Farrell [id number] DOB: [birth year] DOA: 12/07/2019  Referring MD/NP/PA:   PCP: [name], MD   Patient coming from:  The patient is coming from home.  At baseline, pt is independent for most of ADL.        Chief Complaint: positive blood culture  HPI: Dawn Farrell is a 64 y.o. female with medical history significant of hypertension, hyperlipidemia, diabetes mellitus, COPD renal stone, tobacco abuse, thrombocytopenia, who presents with positive blood culture.  Patient states that she has been having right flank pain and suprapubic discomfort for about 1 week.  She was seen in ED yesterday, and found to have acute pyelonephritis with positive urine analysis.  CT scan showed right pyelonephritis. She was given prescription of Keflex. She is called back today since her blood culture is positive for Serratia that was resistant cephalosporin.  Patient still has some right flank pain, but no fever or chills.  She does not have dysuria or burning on urination.  She still has suprapubic discomfort.  She had nausea vomiting yesterday, which has resolved.  Currently no nausea, vomiting, diarrhea or abdominal pain.  No chest pain, shortness breath, cough.  No unilateral weakness.   ED Course: pt was found to have WBC 6.0, electrolytes renal function okay. Negative Covid PCR. Temperature normal, blood pressure 138/92, heart rate 77, RR 15, oxygen sat 97% on room air. Negative chest x-ray. Pt is admitted to [facility] bed as inpatient   Review of Systems:   General: no fevers, chills, no body weight gain, has fatigue HEENT: no blurry vision, hearing changes or sore throat Respiratory: no dyspnea, coughing, wheezing CV: no chest pain, no palpitations GI: no nausea, vomiting, diarrhea, constipation.  Has suprapubic discomfort GU: no dysuria, burning on urination, increased urinary frequency, hematuria  Ext: no leg edema Neuro: no unilateral weakness, numbness, or  tingling, no vision change or hearing loss Skin: no rash, no skin tear. MSK: No muscle spasm, no deformity, no limitation of range of movement in spin Heme: No easy bruising.  Travel history: No recent long distant travel.  Allergy:  Allergies  Allergen Reactions  . Latex Itching and Rash    Past Medical History:  Diagnosis Date  . Cancer ([facility])    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  . COPD (chronic obstructive pulmonary disease) ([place])   . Diabetes mellitus without complication ([place])   . HTN (hypertension)   . Renal calculi     Past Surgical History:  Procedure Laterality Date  . ABDOMINAL HYSTERECTOMY    . CESAREAN SECTION    . CHOLECYSTECTOMY    . COLONOSCOPY WITH PROPOFOL N/A 01/15/2018   Procedure: COLONOSCOPY WITH PROPOFOL;  Surgeon: Toledo, [name], MD;  Location: [facility] ENDOSCOPY;  Service: Gastroenterology;  Laterality: N/A;  . HERNIA REPAIR    . Oral surgery - all teeth removed      Social History:  reports that she has been smoking. She has been smoking about 0.25 packs per day. She has never used smokeless tobacco. She reports that she does not drink alcohol or use drugs.  Family History:  Family History  Problem Relation Age of Onset  . Heart failure Mother   . Heart failure Father   . Breast cancer Sister 54     Prior to Admission medications   Medication Sig Start Date End Date Taking? Authorizing Provider  aspirin EC 81 MG tablet Take 81 mg by mouth at bedtime.    [provider]  atorvastatin (LIPITOR) 40 MG tablet Take 40 mg by mouth at bedtime.     [provider]  cephALEXin (KEFLEX) 500 MG capsule Take 1 capsule (500 mg total) by mouth 3 (three) times daily for 7 days. 12/04/19 12/11/19  Merlyn Lot, MD  enalapril (VASOTEC) 2.5 MG tablet Take 2.5 mg by mouth at bedtime.     [provider]  gabapentin (NEURONTIN) 300 MG capsule Take 300 mg by mouth at bedtime. 12/26/16   [provider]  JARDIANCE 25 MG TABS tablet 25 mg.  12/27/16   [provider]  LANTUS SOLOSTAR 100 UNIT/ML Solostar Pen  11/11/19   [provider]  Magnesium Oxide 200 MG TABS Take 200 mg by mouth 2 (two) times daily.    [provider]  metFORMIN (GLUCOPHAGE-XR) 500 MG 24 hr tablet Take 1,500 mg by mouth every evening.     [provider]  metoprolol succinate (TOPROL-XL) 25 MG 24 hr tablet Take 25 mg by mouth at bedtime.    [provider]  ondansetron (ZOFRAN) 4 MG tablet Take 1 tablet (4 mg total) by mouth daily as needed. 12/04/19 12/03/20  Merlyn Lot, MD  OZEMPIC, 0.25 OR 0.5 MG/DOSE, 2 MG/1.5ML [facility]  11/17/19   [provider]  albuterol (PROVENTIL HFA;VENTOLIN HFA) 108 (90 Base) MCG/ACT inhaler Inhale 1-2 puffs into the lungs every 6 (six) hours as needed for wheezing or shortness of breath. 08/30/18 12/04/19  Norval Gable, MD  Insulin Detemir (LEVEMIR FLEXPEN) 100 UNIT/ML Pen Inject 50 Units into the skin at bedtime.   12/04/19  [provider]    Physical Exam: Vitals:   12/07/19 1312 12/07/19 1400 12/07/19 1500 12/07/19 1800  BP: (!) 138/92 136/78 (!) 147/82 (!) 154/81  Pulse: 77 70 70 75  Resp: 15  16 16   Temp:      TempSrc:      SpO2: 97% 95% 96% 99%  Weight:      Height:       General: Not in acute distress HEENT:       Eyes: PERRL, EOMI, no scleral icterus.       ENT: No discharge from the ears and nose, no pharynx injection, no tonsillar enlargement.        Neck: No JVD, no bruit, no mass felt. Heme: No neck lymph node enlargement. Cardiac: S1/S2, RRR, No murmurs, No gallops or rubs. Respiratory:  No rales, wheezing, rhonchi or rubs. GI: Soft, nondistended, nontender, no rebound pain, no organomegaly, BS present. GU: has right CVA tenderness Ext: No pitting leg edema bilaterally. 2+DP/PT pulse bilaterally. Musculoskeletal: No joint deformities, No joint redness or warmth, no limitation of ROM in spin. Skin: No rashes.  Neuro: Alert, oriented X3,  cranial nerves II-XII grossly intact, moves all extremities normally. Psych: Patient is not psychotic, no suicidal or hemocidal ideation.  Labs on Admission: I have personally reviewed following labs and imaging studies  CBC: Recent Labs  Lab 12/04/19 1012 12/04/19 1236 12/07/19 1103  WBC 18.6* 15.5* 6.0  NEUTROABS 16.6* 13.6* 4.5  HGB 13.6 12.5 12.4  HCT 41.0 38.0 36.9  MCV 85.2 86.0 84.1  PLT 137* 124* [id number]*   Basic Metabolic Panel: Recent Labs  Lab 12/04/19 1012 12/04/19 1236 12/07/19 1103  NA 133* 133* 136  K 4.3 3.9 3.8  CL 99 101 102  CO2 27 23 24   GLUCOSE 89 76 89  BUN 22 23 15   CREATININE 0.94 1.00 0.80  CALCIUM 9.1 9.1 8.6*  GFR: Estimated Creatinine Clearance: 68.9 mL/min (by C-G formula based on SCr of 0.8 mg/dL). Liver Function Tests: Recent Labs  Lab 12/04/19 1236 12/07/19 1103  AST 17 39  ALT 18 49*  ALKPHOS 80 73  BILITOT 1.1 0.7  PROT 7.3 7.2  ALBUMIN 4.2 3.6   No results for input(s): LIPASE, AMYLASE in the last 168 hours. No results for input(s): AMMONIA in the last 168 hours. Coagulation Profile: Recent Labs  Lab 12/07/19 1103  INR 1.0   Cardiac Enzymes: No results for input(s): CKTOTAL, CKMB, CKMBINDEX, TROPONINI in the last 168 hours. BNP (last 3 results) No results for input(s): PROBNP in the last 8760 hours. HbA1C: No results for input(s): HGBA1C in the last 72 hours. CBG: Recent Labs  Lab 12/07/19 1438 12/07/19 1511  GLUCAP 58* 90   Lipid Profile: No results for input(s): CHOL, HDL, LDLCALC, TRIG, CHOLHDL, LDLDIRECT in the last 72 hours. Thyroid Function Tests: No results for input(s): TSH, T4TOTAL, FREET4, T3FREE, THYROIDAB in the last 72 hours. Anemia Panel: No results for input(s): VITAMINB12, FOLATE, FERRITIN, TIBC, IRON, RETICCTPCT in the last 72 hours. Urine analysis:    Component Value Date/Time   COLORURINE YELLOW (A) 12/04/2019 1513   APPEARANCEUR CLEAR (A) 12/04/2019 1513   APPEARANCEUR Cloudy 03/02/2013  2147   LABSPEC >1.046 (H) 12/04/2019 1513   LABSPEC 1.024 03/02/2013 2147   PHURINE 5.0 12/04/2019 1513   GLUCOSEU >=500 (A) 12/04/2019 1513   GLUCOSEU Negative 03/02/2013 2147   HGBUR SMALL (A) 12/04/2019 1513   BILIRUBINUR NEGATIVE 12/04/2019 1513   BILIRUBINUR Negative 03/02/2013 2147   KETONESUR NEGATIVE 12/04/2019 1513   PROTEINUR 30 (A) 12/04/2019 1513   NITRITE NEGATIVE 12/04/2019 1513   LEUKOCYTESUR NEGATIVE 12/04/2019 1513   LEUKOCYTESUR Trace 03/02/2013 2147   Sepsis Labs: @LABRCNTIP (procalcitonin:4,lacticidven:4) ) Recent Results (from the past 240 hour(s))  Urine culture     Status: Abnormal   Collection Time: 12/04/19  9:17 AM   Specimen: Urine, Clean Catch  Result Value Ref Range Status   Specimen Description   Final    URINE, CLEAN CATCH Performed at [facility], [address], [place], [place] [address]    Special Requests   Final    NONE Performed at [facility], [address] [place], [place] [address]    Culture 80,000 COLONIES/mL SERRATIA MARCESCENS (A)  Final   Report Status 12/07/2019 FINAL  Final   Organism ID, Bacteria SERRATIA MARCESCENS (A)  Final      Susceptibility   Serratia marcescens - MIC*    CEFAZOLIN >=64 RESISTANT Resistant     CEFTRIAXONE <=0.25 SENSITIVE Sensitive     CIPROFLOXACIN <=0.25 SENSITIVE Sensitive     GENTAMICIN <=1 SENSITIVE Sensitive     NITROFURANTOIN 256 RESISTANT Resistant     TRIMETH/SULFA <=20 SENSITIVE Sensitive     * 80,000 COLONIES/mL SERRATIA MARCESCENS  Blood Culture (routine x 2)     Status: Abnormal   Collection Time: 12/04/19 12:36 PM   Specimen: BLOOD  Result Value Ref Range Status   Specimen Description   Final    BLOOD BLOOD RIGHT FOREARM Performed at [facility], [address] [place], [place] [address]    Special Requests   Final    BOTTLES DRAWN AEROBIC AND ANAEROBIC Blood Culture adequate volume Performed at [facility], [address], [place], [place] [address]    Culture  Setup Time   Final    GRAM NEGATIVE RODS IN BOTH AEROBIC  AND ANAEROBIC BOTTLES CRITICAL VALUE NOTED.  VALUE IS CONSISTENT WITH PREVIOUSLY REPORTED AND CALLED VALUE.    Culture (A)  Final    SERRATIA MARCESCENS SUSCEPTIBILITIES PERFORMED ON PREVIOUS CULTURE WITHIN THE LAST 5 DAYS. Performed at [facility], [place] [address], [place], [place] [address]    Report Status 12/07/2019 FINAL  Final  Blood Culture (routine x 2)     Status: Abnormal   Collection Time: 12/04/19 12:37 PM   Specimen: BLOOD  Result Value Ref Range Status   Specimen Description   Final    BLOOD BLOOD RIGHT FOREARM Performed at [facility], [address], [place], [place] [address]    Special Requests   Final    BOTTLES DRAWN AEROBIC AND ANAEROBIC Blood Culture adequate volume Performed at [facility], [address] [place], [place] [address]    Culture  Setup Time   Final    GRAM NEGATIVE RODS IN BOTH AEROBIC AND ANAEROBIC BOTTLES CRITICAL RESULT CALLED TO, READ BACK BY AND VERIFIED WITH: BRANDY DAVIS AT 1124 12/05/19.PMF    Culture SERRATIA MARCESCENS (A)  Final   Report Status 12/07/2019 FINAL  Final   Organism ID, Bacteria SERRATIA MARCESCENS  Final      Susceptibility   Serratia marcescens - MIC*    CEFAZOLIN >=64 RESISTANT Resistant     CEFEPIME <=0.12 SENSITIVE Sensitive     CEFTAZIDIME <=1 SENSITIVE Sensitive     CEFTRIAXONE <=0.25 SENSITIVE Sensitive     CIPROFLOXACIN <=0.25 SENSITIVE Sensitive     GENTAMICIN <=1 SENSITIVE Sensitive     TRIMETH/SULFA <=20 SENSITIVE Sensitive     * SERRATIA MARCESCENS  Blood Culture ID Panel (Reflexed)     Status: Abnormal   Collection Time: 12/04/19 12:37 PM  Result Value Ref Range Status   Enterococcus species NOT DETECTED NOT DETECTED Final   Listeria monocytogenes NOT DETECTED NOT DETECTED Final   Staphylococcus species NOT DETECTED NOT DETECTED Final   Staphylococcus aureus (BCID) NOT  DETECTED NOT DETECTED Final   Streptococcus species NOT DETECTED NOT DETECTED Final   Streptococcus agalactiae NOT DETECTED NOT DETECTED Final   Streptococcus pneumoniae NOT DETECTED NOT DETECTED Final   Streptococcus pyogenes NOT DETECTED NOT DETECTED Final   Acinetobacter baumannii NOT DETECTED NOT DETECTED Final   Enterobacteriaceae species DETECTED (A) NOT DETECTED Final    Comment: Enterobacteriaceae represent a large family of gram-negative bacteria, not a single organism. CRITICAL RESULT CALLED TO, READ BACK BY AND VERIFIED WITH: BRANDY DAVIS AT 1124 12/05/19.PMF    Enterobacter cloacae complex NOT DETECTED NOT DETECTED Final   Escherichia coli NOT DETECTED NOT DETECTED Final   Klebsiella oxytoca NOT DETECTED NOT DETECTED Final   Klebsiella pneumoniae NOT DETECTED NOT DETECTED Final   Proteus species NOT DETECTED NOT DETECTED Final   Serratia marcescens DETECTED (A) NOT DETECTED Final    Comment: CRITICAL RESULT CALLED TO, READ BACK BY AND VERIFIED WITH: BRANDY DAVIS AT 1124 12/05/19.PMF    Carbapenem resistance NOT DETECTED NOT DETECTED Final   Haemophilus influenzae NOT DETECTED NOT DETECTED Final   Neisseria meningitidis NOT DETECTED NOT DETECTED Final   Pseudomonas aeruginosa NOT DETECTED NOT DETECTED Final   Candida albicans NOT DETECTED NOT DETECTED Final   Candida glabrata NOT DETECTED NOT DETECTED Final   Candida krusei NOT DETECTED NOT DETECTED Final   Candida parapsilosis NOT DETECTED NOT DETECTED Final   Candida tropicalis NOT DETECTED NOT DETECTED Final    Comment: Performed at [facility], [place]  [address] [place], [place] [address]  Urine culture     Status: Abnormal   Collection Time: 12/04/19  3:13 PM   Specimen: In/Out Cath Urine  Result Value Ref Range Status   Specimen Description   Final    IN/OUT CATH URINE Performed at [facility], [address] [place], [place] [address]    Special Requests   Final    NONE Performed at  [facility], [place] [place], [place] [address]    Culture MULTIPLE SPECIES PRESENT, SUGGEST RECOLLECTION (A)  Final   Report Status 12/06/2019 FINAL  Final  Respiratory Panel by RT PCR (Flu A&B, Covid) - Nasopharyngeal Swab     Status: None   Collection Time: 12/07/19  1:31 PM   Specimen: Nasopharyngeal Swab  Result Value Ref Range Status   SARS Coronavirus 2 by RT PCR NEGATIVE NEGATIVE Final    Comment: (NOTE) SARS-CoV-2 target nucleic acids are NOT DETECTED. The SARS-CoV-2 RNA is generally detectable in upper respiratoy specimens during the acute phase of infection. The lowest concentration of SARS-CoV-2 viral copies this assay can detect is 131 copies/mL. A negative result does not preclude SARS-Cov-2 infection and should not be used as the sole basis for treatment or other patient management decisions. A negative result may occur with  improper specimen collection/handling, submission of specimen other than nasopharyngeal swab, presence of viral mutation(s) within the areas targeted by this assay, and inadequate number of viral copies (<131 copies/mL). A negative result must be combined with clinical observations, patient history, and epidemiological information. The expected result is Negative. Fact Sheet for Patients:  PinkCheek.be Fact Sheet for Healthcare Providers:  GravelBags.it This test is not yet ap proved or cleared by the [place] FDA and  has been authorized for detection and/or diagnosis of SARS-CoV-2 by FDA under an Emergency Use Authorization (EUA). This EUA will remain  in effect (meaning this test can be used) for the duration of the COVID-19 declaration under Section 564(b)(1) of the Act, 21 U.S.C. section 360bbb-3(b)(1), unless the authorization is terminated or revoked sooner.    Influenza A by PCR NEGATIVE NEGATIVE Final   Influenza B by PCR NEGATIVE NEGATIVE Final     Comment: (NOTE) The Xpert Xpress SARS-CoV-2/FLU/RSV assay is intended as an aid in  the diagnosis of influenza from Nasopharyngeal swab specimens and  should not be used as a sole basis for treatment. Nasal washings and  aspirates are unacceptable for Xpert Xpress SARS-CoV-2/FLU/RSV  testing. Fact Sheet for Patients: PinkCheek.be Fact Sheet for Healthcare Providers: GravelBags.it This test is not yet approved or cleared by the [place] FDA and  has been authorized for detection and/or diagnosis of SARS-CoV-2 by  FDA under an Emergency Use Authorization (EUA). This EUA will remain  in effect (meaning this test can be used) for the duration of the  Covid-19 declaration under Section 564(b)(1) of the Act, 21  U.S.C. section 360bbb-3(b)(1), unless the authorization is  terminated or revoked. Performed at [facility], [address] [place], [place] [address]      Radiological Exams on Admission: DG Chest 2 View  Result Date: 12/07/2019 CLINICAL DATA:  Positive blood cultures EXAM: CHEST - 2 VIEW COMPARISON:  12/04/2019 FINDINGS: The heart size and mediastinal contours are within normal limits. Both lungs are clear. Calcified granulomas are again noted. No pleural effusion or pneumothorax. The visualized skeletal structures are unremarkable. IMPRESSION: No acute process in the chest. Electronically Signed   By: Macy Mis  M.D.   On: 12/07/2019 11:21     EKG: Independently reviewed.  Sinus rhythm, QTC 442, LAE, no ischemic change  Assessment/Plan Principal Problem:   Acute pyelonephritis Active Problems:   Thrombocytopenia (HCC)   COPD (chronic obstructive pulmonary disease) (HCC)   Diabetes mellitus without complication (HCC)   HTN (hypertension)   HLD (hyperlipidemia)   Tobacco abuse   Bacteremia due to Gram-negative bacteria   Aute pyelonephritis and Bacteremia due to Gram-negative bacteria  (S[place]atia): Patient does not have fever or leukocytosis.  Clinically not septic.  Hemodynamically stable.  -Admitted to MedSurg bed as inpatient -IV cefepime -Repeat blood culture  Thrombocytopenia ([place]): This is a chronic diffuse.  Platelet of 12.3 -Follow-up by CBC  COPD (chronic obstructive pulmonary disease) ([facility]): stable -prn albuterol and Mucinex  Diabetes mellitus without complication ([place]): Most recent A1c , poorly fairly well controled. Patient is taking Ozempic, Metformin, Jardiance, Lantus at home.  Blood sugar is 89. -will decrease Lantus dose from 60 to 20 units daily -SSI  HTN:  -Continue home medications: Enalapril, metoprolol -hydralazine prn  HLD (hyperlipidemia): -lipitor  Tobacco abuse -Nicotine patch    Inpatient status:  # Patient requires inpatient status due to high intensity of service, high risk for further deterioration and high frequency of surveillance required.  I certify that at the point of admission it is my clinical judgment that the patient will require inpatient hospital care spanning beyond 2 midnights from the point of admission.  . This patient has multiple chronic comorbidities including hypertension, hyperlipidemia, diabetes mellitus, COPD renal stone, tobacco abuse, thrombocytopenia .  [name] Now patient has presenting with aute pyelonephritis and Bacteremia due to Gram-negative bacteria (Serratia) . The worrisome physical exam findings include right flank tenderness . The initial radiographic and laboratory data are worrisome because of bacteremia  . current medical needs: please see my assessment and plan . Predictability of an adverse outcome (risk): Patient has multiple comorbidities as listed above. Now presents with aute pyelonephritis and Bacteremia due to Gram-negative bacteria (Serratia). Patient's presentation is highly complicated.  Patient is at high risk of deteriorating, such as developing sepsis or septic shock. Will need to be  treated in hospital for at least 2 days.            DVT ppx: SQ Heparin   Code Status: Full code Family Communication: None at bed side. Disposition Plan:  Anticipate discharge back to previous home environment Consults called:  none Admission status: Med-surg bed as inpt     Date of Service 12/07/2019    [facility]   If 7PM-7AM, please contact night-coverage [email or web address] 12/07/2019, 6:57 PM

## 2019-12-08 ENCOUNTER — Encounter: Payer: Self-pay | Admitting: Internal Medicine

## 2019-12-08 ENCOUNTER — Other Ambulatory Visit: Payer: Self-pay

## 2019-12-08 DIAGNOSIS — Z87440 Personal history of urinary (tract) infections: Secondary | ICD-10-CM

## 2019-12-08 DIAGNOSIS — N12 Tubulo-interstitial nephritis, not specified as acute or chronic: Secondary | ICD-10-CM

## 2019-12-08 DIAGNOSIS — J449 Chronic obstructive pulmonary disease, unspecified: Secondary | ICD-10-CM

## 2019-12-08 DIAGNOSIS — B9689 Other specified bacterial agents as the cause of diseases classified elsewhere: Secondary | ICD-10-CM

## 2019-12-08 DIAGNOSIS — Z79899 Other long term (current) drug therapy: Secondary | ICD-10-CM

## 2019-12-08 DIAGNOSIS — I10 Essential (primary) hypertension: Secondary | ICD-10-CM

## 2019-12-08 DIAGNOSIS — Z91040 Latex allergy status: Secondary | ICD-10-CM

## 2019-12-08 DIAGNOSIS — R7881 Bacteremia: Secondary | ICD-10-CM

## 2019-12-08 DIAGNOSIS — Z87442 Personal history of urinary calculi: Secondary | ICD-10-CM

## 2019-12-08 DIAGNOSIS — E785 Hyperlipidemia, unspecified: Secondary | ICD-10-CM

## 2019-12-08 DIAGNOSIS — Z794 Long term (current) use of insulin: Secondary | ICD-10-CM

## 2019-12-08 DIAGNOSIS — F17210 Nicotine dependence, cigarettes, uncomplicated: Secondary | ICD-10-CM

## 2019-12-08 DIAGNOSIS — I471 Supraventricular tachycardia: Secondary | ICD-10-CM

## 2019-12-08 DIAGNOSIS — E118 Type 2 diabetes mellitus with unspecified complications: Secondary | ICD-10-CM

## 2019-12-08 LAB — URINALYSIS, COMPLETE (UACMP) WITH MICROSCOPIC
Bacteria, UA: NONE SEEN
Bilirubin Urine: NEGATIVE
Glucose, UA: >=500 mg/dL — AB
Hgb urine dipstick: NEGATIVE
Ketones, ur: NEGATIVE mg/dL
Leukocytes,Ua: NEGATIVE
Nitrite: NEGATIVE
Protein, ur: NEGATIVE mg/dL
Specific Gravity, Urine: 1.01 (ref 1.005–1.030)
Squamous Epithelial / HPF: NONE SEEN (ref 0–5)
pH: 7 (ref 5.0–8.0)

## 2019-12-08 LAB — GLUCOSE, CAPILLARY
Glucose-Capillary: 100 mg/dL — ABNORMAL HIGH (ref 70–99)
Glucose-Capillary: 101 mg/dL — ABNORMAL HIGH (ref 70–99)
Glucose-Capillary: 102 mg/dL — ABNORMAL HIGH (ref 70–99)
Glucose-Capillary: 122 mg/dL — ABNORMAL HIGH (ref 70–99)
Glucose-Capillary: 147 mg/dL — ABNORMAL HIGH (ref 70–99)
Glucose-Capillary: 71 mg/dL (ref 70–99)

## 2019-12-08 LAB — BASIC METABOLIC PANEL
Anion gap: 5 (ref 5–15)
BUN: 13 mg/dL (ref 8–23)
CO2: 27 mmol/L (ref 22–32)
Calcium: 8.4 mg/dL — ABNORMAL LOW (ref 8.9–10.3)
Chloride: 106 mmol/L (ref 98–111)
Creatinine, Ser: 0.8 mg/dL (ref 0.44–1.00)
GFR calc Af Amer: >60 mL/min (ref >60)
GFR calc non Af Amer: >60 mL/min (ref >60)
Glucose, Bld: 112 mg/dL — ABNORMAL HIGH (ref 70–99)
Potassium: 3.5 mmol/L (ref 3.5–5.1)
Sodium: 138 mmol/L (ref 135–145)

## 2019-12-08 LAB — CBC
HCT: 33.6 % — ABNORMAL LOW (ref 36.0–46.0)
Hemoglobin: 11.3 g/dL — ABNORMAL LOW (ref 12.0–15.0)
MCH: 28.3 pg (ref 26.0–34.0)
MCHC: 33.6 g/dL (ref 30.0–36.0)
MCV: 84 fL (ref 80.0–100.0)
Platelets: 121 10*3/uL — ABNORMAL LOW (ref 150–400)
RBC: 4 MIL/uL (ref 3.87–5.11)
RDW: 13 % (ref 11.5–15.5)
WBC: 5.7 10*3/uL (ref 4.0–10.5)
nRBC: 0 % (ref 0.0–0.2)

## 2019-12-08 LAB — HIV ANTIBODY (ROUTINE TESTING W REFLEX): HIV Screen 4th Generation wRfx: NONREACTIVE

## 2019-12-08 MED ORDER — SODIUM CHLORIDE 0.9 % IV SOLN: INTRAVENOUS | Status: DC | PRN

## 2019-12-08 MED ORDER — ATORVASTATIN CALCIUM 20 MG PO TABS
40.0000 mg | ORAL_TABLET | Freq: Every day | ORAL | Status: DC
  Administered 2019-12-08 (×2): 40 mg via ORAL
  Filled 2019-12-08: qty 2

## 2019-12-08 NOTE — Progress Notes (Signed)
PROGRESS NOTE    Dawn Farrell  [id number] DOB: Mar 01, 1956 DOA: 12/07/2019 PCP: [name], MD   Brief Narrative:  Dawn Farrell is a 64 y.o. female with medical history significant of hypertension, hyperlipidemia, diabetes mellitus, COPD renal stone, tobacco abuse, thrombocytopenia, who presents with positive blood culture. Apparently patient was seen in ED on 12/04/2019 with urinary symptoms.  CT scan shows some right pyelonephritis.  She was discharged home from ED with Keflex.  She was called back yesterday once her blood culture came back positive for Serratia which was resistant to Keflex.  Subjective: Patient was feeling better when seen this morning.  Her right flank and lower abdominal pain is improving.  No more fever or chills. Patient works in a nursing facility.  No history of recent urologic procedure.  Assessment & Plan:   Principal Problem:   Acute pyelonephritis Active Problems:   Thrombocytopenia (HCC)   COPD (chronic obstructive pulmonary disease) (HCC)   Diabetes mellitus without complication (HCC)   HTN (hypertension)   HLD (hyperlipidemia)   Tobacco abuse   Bacteremia due to Gram-negative bacteria  Aute pyelonephritis and Bacteremia due to Gram-negative bacteria ([place]): Patient does not have fever or leukocytosis.  Clinically not septic.  Hemodynamically stable.  It is unusual to have Serratia bacteremia. Plan cultures with multiple growth advising re collection.  No prior history of any urologic procedure. -Repeat blood cultures are negative. -Patient has good sensitivity. -ID consult for proper antibiotic duration. -Continue with cefepime for now-we will change according to ID recommendations, likely can go home on p.o. tomorrow.  Chronic thrombocytopenia.  Platelet stable around 121. - continue to monitor  COPD (chronic obstructive pulmonary disease) ([place]): stable -prn albuterol and Mucinex  Diabetes mellitus without complication ([place]):  Most recent A1c of 7.31 August 2019, Patient is taking Ozempic, Metformin, Jardiance, Lantus at home.   CBG remained within goal. -Continue decrease Lantus dose from 60 to 20 units daily -SSI  HTN:  -Continue home medications: Enalapril, metoprolol -hydralazine prn  HLD (hyperlipidemia): -lipitor  Tobacco abuse -Nicotine patch  Objective: Vitals:   12/08/19 0000 12/08/19 0135 12/08/19 0446 12/08/19 1131  BP: 137/78 (!) 160/86 136/78 (!) 146/84  Pulse: 73 81 79 74  Resp:  20 20 16   Temp:  98.4 F (36.9 C) 98.5 F (36.9 C) 97.9 F (36.6 C)  TempSrc:  Oral Oral Oral  SpO2: 96% 99% 99% 99%  Weight:      Height:        Intake/Output Summary (Last 24 hours) at 12/08/2019 1318 Last data filed at 12/08/2019 1200 Gross per 24 hour  Intake 335.55 ml  Output 700 ml  Net -364.45 ml   Filed Weights   12/07/19 1048  Weight: 73 kg    Examination:  General exam: Appears calm and comfortable  Respiratory system: Clear to auscultation. Respiratory effort normal. Cardiovascular system: S1 & S2 heard, RRR. No JVD, murmurs, rubs, gallops or clicks. Gastrointestinal system: Soft, nontender, nondistended, bowel sounds positive. Central nervous system: Alert and oriented. No focal neurological deficits.Symmetric 5 x 5 power. Extremities: No edema, no cyanosis, pulses intact and symmetrical. Skin: No rashes, lesions or ulcers Psychiatry: Judgement and insight appear normal. Mood & affect appropriate.    DVT prophylaxis: Heparin Code Status: Full Family Communication: No family at bedside Disposition Plan: Pending ID consult.  Most likely will go back home by tomorrow.  Consultants:   ID  Procedures:  Antimicrobials: Cefepime  Data Reviewed: I have personally reviewed following labs  and imaging studies  CBC: Recent Labs  Lab 12/04/19 1012 12/04/19 1236 12/07/19 1103 12/08/19 0439  WBC 18.6* 15.5* 6.0 5.7  NEUTROABS 16.6* 13.6* 4.5  --   HGB 13.6 12.5 12.4 11.3*    HCT 41.0 38.0 36.9 33.6*  MCV 85.2 86.0 84.1 84.0  PLT 137* 124* 123* [id number]*   Basic Metabolic Panel: Recent Labs  Lab 12/04/19 1012 12/04/19 1236 12/07/19 1103 12/08/19 0439  NA 133* 133* 136 138  K 4.3 3.9 3.8 3.5  CL 99 101 102 106  CO2 27 23 24 27   GLUCOSE 89 76 89 112*  BUN 22 23 15 13   CREATININE 0.94 1.00 0.80 0.80  CALCIUM 9.1 9.1 8.6* 8.4*   GFR: Estimated Creatinine Clearance: 68.9 mL/min (by C-G formula based on SCr of 0.8 mg/dL). Liver Function Tests: Recent Labs  Lab 12/04/19 1236 12/07/19 1103  AST 17 39  ALT 18 49*  ALKPHOS 80 73  BILITOT 1.1 0.7  PROT 7.3 7.2  ALBUMIN 4.2 3.6   No results for input(s): LIPASE, AMYLASE in the last 168 hours. No results for input(s): AMMONIA in the last 168 hours. Coagulation Profile: Recent Labs  Lab 12/07/19 1103  INR 1.0   Cardiac Enzymes: No results for input(s): CKTOTAL, CKMB, CKMBINDEX, TROPONINI in the last 168 hours. BNP (last 3 results) No results for input(s): PROBNP in the last 8760 hours. HbA1C: No results for input(s): HGBA1C in the last 72 hours. CBG: Recent Labs  Lab 12/07/19 1511 12/08/19 0006 12/08/19 0500 12/08/19 0747 12/08/19 1132  GLUCAP 90 122* 102* 71 101*   Lipid Profile: No results for input(s): CHOL, HDL, LDLCALC, TRIG, CHOLHDL, LDLDIRECT in the last 72 hours. Thyroid Function Tests: No results for input(s): TSH, T4TOTAL, FREET4, T3FREE, THYROIDAB in the last 72 hours. Anemia Panel: No results for input(s): VITAMINB12, FOLATE, FERRITIN, TIBC, IRON, RETICCTPCT in the last 72 hours. Sepsis Labs: Recent Labs  Lab 12/04/19 1236 12/07/19 1103  LATICACIDVEN 0.8 1.0    Recent Results (from the past 240 hour(s))  Urine culture     Status: Abnormal   Collection Time: 12/04/19  9:17 AM   Specimen: Urine, Clean Catch  Result Value Ref Range Status   Specimen Description   Final    URINE, CLEAN CATCH Performed at [facility] [place], [address], [place], [place]  [address]    Special Requests   Final    NONE Performed at [facility], [address], [place], [place] [address]    Culture 80,000 COLONIES/mL SERRATIA MARCESCENS (A)  Final   Report Status 12/07/2019 FINAL  Final   Organism ID, Bacteria SERRATIA MARCESCENS (A)  Final      Susceptibility   Serratia marcescens - MIC*    CEFAZOLIN >=64 RESISTANT Resistant     CEFTRIAXONE <=0.25 SENSITIVE Sensitive     CIPROFLOXACIN <=0.25 SENSITIVE Sensitive     GENTAMICIN <=1 SENSITIVE Sensitive     NITROFURANTOIN 256 RESISTANT Resistant     TRIMETH/SULFA <=20 SENSITIVE Sensitive     * 80,000 COLONIES/mL SERRATIA MARCESCENS  Blood Culture (routine x 2)     Status: Abnormal   Collection Time: 12/04/19 12:36 PM   Specimen: BLOOD  Result Value Ref Range Status   Specimen Description   Final    BLOOD BLOOD RIGHT FOREARM Performed at [facility], [address], [place], [place] [address]    Special Requests   Final    BOTTLES DRAWN AEROBIC AND ANAEROBIC Blood Culture  adequate volume Performed at [facility], [address] [place], [place] [address]    Culture  Setup Time   Final    GRAM NEGATIVE RODS IN BOTH AEROBIC AND ANAEROBIC BOTTLES CRITICAL VALUE NOTED.  VALUE IS CONSISTENT WITH PREVIOUSLY REPORTED AND CALLED VALUE.    Culture (A)  Final    SERRATIA MARCESCENS SUSCEPTIBILITIES PERFORMED ON PREVIOUS CULTURE WITHIN THE LAST 5 DAYS. Performed at [facility], [address], [place], [place] [address]    Report Status 12/07/2019 FINAL  Final  Blood Culture (routine x 2)     Status: Abnormal   Collection Time: 12/04/19 12:37 PM   Specimen: BLOOD  Result Value Ref Range Status   Specimen Description   Final    BLOOD BLOOD RIGHT FOREARM Performed at [facility], [address], [place], [place] [address]    Special Requests   Final    BOTTLES DRAWN AEROBIC AND ANAEROBIC Blood Culture adequate volume Performed at [facility],  [address] [place], [place] [address]    Culture  Setup Time   Final    GRAM NEGATIVE RODS IN BOTH AEROBIC AND ANAEROBIC BOTTLES CRITICAL RESULT CALLED TO, READ BACK BY AND VERIFIED WITH: BRANDY DAVIS AT 1124 12/05/19.PMF    Culture SERRATIA MARCESCENS (A)  Final   Report Status 12/07/2019 FINAL  Final   Organism ID, Bacteria SERRATIA MARCESCENS  Final      Susceptibility   Serratia marcescens - MIC*    CEFAZOLIN >=64 RESISTANT Resistant     CEFEPIME <=0.12 SENSITIVE Sensitive     CEFTAZIDIME <=1 SENSITIVE Sensitive     CEFTRIAXONE <=0.25 SENSITIVE Sensitive     CIPROFLOXACIN <=0.25 SENSITIVE Sensitive     GENTAMICIN <=1 SENSITIVE Sensitive     TRIMETH/SULFA <=20 SENSITIVE Sensitive     * SERRATIA MARCESCENS  Blood Culture ID Panel (Reflexed)     Status: Abnormal   Collection Time: 12/04/19 12:37 PM  Result Value Ref Range Status   Enterococcus species NOT DETECTED NOT DETECTED Final   Listeria monocytogenes NOT DETECTED NOT DETECTED Final   Staphylococcus species NOT DETECTED NOT DETECTED Final   Staphylococcus aureus (BCID) NOT DETECTED NOT DETECTED Final   Streptococcus species NOT DETECTED NOT DETECTED Final   Streptococcus agalactiae NOT DETECTED NOT DETECTED Final   Streptococcus pneumoniae NOT DETECTED NOT DETECTED Final   Streptococcus pyogenes NOT DETECTED NOT DETECTED Final   Acinetobacter baumannii NOT DETECTED NOT DETECTED Final   Enterobacteriaceae species DETECTED (A) NOT DETECTED Final    Comment: Enterobacteriaceae represent a large family of gram-negative bacteria, not a single organism. CRITICAL RESULT CALLED TO, READ BACK BY AND VERIFIED WITH: BRANDY DAVIS AT 1124 12/05/19.PMF    Enterobacter cloacae complex NOT DETECTED NOT DETECTED Final   Escherichia coli NOT DETECTED NOT DETECTED Final   Klebsiella oxytoca NOT DETECTED NOT DETECTED Final   Klebsiella pneumoniae NOT DETECTED NOT DETECTED Final   Proteus species NOT DETECTED NOT DETECTED Final    Serratia marcescens DETECTED (A) NOT DETECTED Final    Comment: CRITICAL RESULT CALLED TO, READ BACK BY AND VERIFIED WITH: BRANDY DAVIS AT 1124 12/05/19.PMF    Carbapenem resistance NOT DETECTED NOT DETECTED Final   Haemophilus influenzae NOT DETECTED NOT DETECTED Final   Neisseria meningitidis NOT DETECTED NOT DETECTED Final   Pseudomonas aeruginosa NOT DETECTED NOT DETECTED Final   Candida albicans NOT DETECTED NOT DETECTED Final   Candida glabrata NOT DETECTED NOT DETECTED Final   Candida krusei  NOT DETECTED NOT DETECTED Final   Candida parapsilosis NOT DETECTED NOT DETECTED Final   Candida tropicalis NOT DETECTED NOT DETECTED Final    Comment: Performed at [facility], [address] [place], [place] [address]  Urine culture     Status: Abnormal   Collection Time: 12/04/19  3:13 PM   Specimen: In/Out Cath Urine  Result Value Ref Range Status   Specimen Description   Final    IN/OUT CATH URINE Performed at [facility], [address], [place], [place] [address]    Special Requests   Final    NONE Performed at [facility], [address] [place], [place] [address]    Culture MULTIPLE SPECIES PRESENT, SUGGEST RECOLLECTION (A)  Final   Report Status 12/06/2019 FINAL  Final  Culture, blood (Routine x 2)     Status: None (Preliminary result)   Collection Time: 12/07/19 11:03 AM   Specimen: BLOOD  Result Value Ref Range Status   Specimen Description BLOOD BLOOD LEFT FOREARM  Final   Special Requests   Final    BOTTLES DRAWN AEROBIC AND ANAEROBIC Blood Culture results may not be optimal due to an excessive volume of blood received in culture bottles   Culture   Final    NO GROWTH < 24 HOURS Performed at [facility], [address] [place], [place] [address]    Report Status PENDING  Incomplete  Culture, blood (Routine x 2)     Status: None (Preliminary result)   Collection Time: 12/07/19  1:11 PM   Specimen: BLOOD  Result Value Ref  Range Status   Specimen Description BLOOD RIGHT ANTECUBITAL  Final   Special Requests   Final    BOTTLES DRAWN AEROBIC AND ANAEROBIC Blood Culture adequate volume   Culture   Final    NO GROWTH < 24 HOURS Performed at [facility], [address] [place], [place] [address]    Report Status PENDING  Incomplete  Respiratory Panel by RT PCR (Flu A&B, Covid) - Nasopharyngeal Swab     Status: None   Collection Time: 12/07/19  1:31 PM   Specimen: Nasopharyngeal Swab  Result Value Ref Range Status   SARS Coronavirus 2 by RT PCR NEGATIVE NEGATIVE Final    Comment: (NOTE) SARS-CoV-2 target nucleic acids are NOT DETECTED. The SARS-CoV-2 RNA is generally detectable in upper respiratoy specimens during the acute phase of infection. The lowest concentration of SARS-CoV-2 viral copies this assay can detect is 131 copies/mL. A negative result does not preclude SARS-Cov-2 infection and should not be used as the sole basis for treatment or other patient management decisions. A negative result may occur with  improper specimen collection/handling, submission of specimen other than nasopharyngeal swab, presence of viral mutation(s) within the areas targeted by this assay, and inadequate number of viral copies (<131 copies/mL). A negative result must be combined with clinical observations, patient history, and epidemiological information. The expected result is Negative. Fact Sheet for Patients:  PinkCheek.be Fact Sheet for Healthcare Providers:  GravelBags.it This test is not yet ap proved or cleared by the [place] FDA and  has been authorized for detection and/or diagnosis of SARS-CoV-2 by FDA under an Emergency Use Authorization (EUA). This EUA will remain  in effect (meaning this test can be used) for the duration of the COVID-19 declaration under Section 564(b)(1) of the Act, 21 U.S.C. section 360bbb-3(b)(1), unless the  authorization is terminated or revoked sooner.    Influenza A by  PCR NEGATIVE NEGATIVE Final   Influenza B by PCR NEGATIVE NEGATIVE Final    Comment: (NOTE) The Xpert Xpress SARS-CoV-2/FLU/RSV assay is intended as an aid in  the diagnosis of influenza from Nasopharyngeal swab specimens and  should not be used as a sole basis for treatment. Nasal washings and  aspirates are unacceptable for Xpert Xpress SARS-CoV-2/FLU/RSV  testing. Fact Sheet for Patients: PinkCheek.be Fact Sheet for Healthcare Providers: GravelBags.it This test is not yet approved or cleared by the [place] FDA and  has been authorized for detection and/or diagnosis of SARS-CoV-2 by  FDA under an Emergency Use Authorization (EUA). This EUA will remain  in effect (meaning this test can be used) for the duration of the  Covid-19 declaration under Section 564(b)(1) of the Act, 21  U.S.C. section 360bbb-3(b)(1), unless the authorization is  terminated or revoked. Performed at [facility], [address], [place], [place] [address]      Radiology Studies: DG Chest 2 View  Result Date: 12/07/2019 CLINICAL DATA:  Positive blood cultures EXAM: CHEST - 2 VIEW COMPARISON:  12/04/2019 FINDINGS: The heart size and mediastinal contours are within normal limits. Both lungs are clear. Calcified granulomas are again noted. No pleural effusion or pneumothorax. The visualized skeletal structures are unremarkable. IMPRESSION: No acute process in the chest. Electronically Signed   By: Macy Mis M.D.   On: 12/07/2019 11:21    Scheduled Meds: . aspirin EC  81 mg Oral QHS  . atorvastatin  40 mg Oral QHS  . dextromethorphan-guaiFENesin  1 tablet Oral BID  . enalapril  2.5 mg Oral QHS  . gabapentin  300 mg Oral QHS  . heparin  5,000 Units Subcutaneous Q8H  . insulin aspart  0-9 Units Subcutaneous Q4H  . insulin glargine  20 Units Subcutaneous QHS  .  magnesium oxide  200 mg Oral BID  . metoprolol succinate  25 mg Oral QHS  . nicotine  21 mg Transdermal Daily   Continuous Infusions: . sodium chloride Stopped (12/08/19 0753)  . ceFEPime (MAXIPIME) IV Stopped (12/08/19 0620)     LOS: 1 day   Time spent: 40 minutes.  Lorella Nimrod, MD [facility]  If 7PM-7AM, please contact night-coverage [email or web address]  12/08/2019, 1:18 PM   This record has been created using Systems analyst. Errors have been sought and corrected,but may not always be located. Such creation errors do not reflect on the standard of care.

## 2019-12-08 NOTE — Consult Note (Signed)
NAME: Dawn Farrell  DOB: [birth year]  MRN: [id number]  Date/Time: 12/08/2019 6:43 PM  REQUESTING PROVIDER: Dr. Reesa Chew Subjective:  REASON FOR CONSULT: Serratia bacteremia History  from patient and chart? Dawn Farrell is a 64 y.o. female with a history of hypertension, COPD, diabetes, renal calculi, SVT, presented to urgent care on 12/04/2019 with right-sided flank pain along with some nausea.  She has history of kidney stones.  Patient  had recently completed a course of Macrobid for a UTI last week.  she had a temperature of 98.5 which later increased to 100.5 urine analysis showed 11-20 WBC and white count was 18.  She was transferred to [facility] ED and was discharged home on Keflex.  As blood culture grew Serratia she was called back for admission. She came back to the ED yesterday 12/07/2019.  She has been started on cefepime.  I am asked to see the patient for the same.  CT abdomen done on 12/04/2019 revealed positive right pyelonephritis.  Urine culture from 12/04/2019 showed Serratia marcescens. 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 She says she has not had any procedures for the kidney or bladder in the recent past. She has frequent UTI. Her blood sugar not well controlled and she is on 4 different medications     Past Medical History:  Diagnosis Date  . Cancer ([place])    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  . COPD (chronic obstructive pulmonary disease) ([place])   . Diabetes mellitus without complication ([place])   . HTN (hypertension)   . Renal calculi   History of thrombocytopenia, hyperlipidemia, fatty liver, obesity Positive rheumatoid factor but not on any treatment  Past Surgical History:  Procedure Laterality Date  . ABDOMINAL HYSTERECTOMY    . CESAREAN SECTION    . CHOLECYSTECTOMY    . COLONOSCOPY WITH PROPOFOL N/A 01/15/2018   Procedure: COLONOSCOPY WITH PROPOFOL;  Surgeon: Toledo, [name], MD;  Location: [facility] ENDOSCOPY;  Service: Gastroenterology;  Laterality: N/A;  . HERNIA REPAIR    . Oral  surgery - all teeth removed      Social History   Socioeconomic History  . Marital status: Married    Spouse name: Not on file  . Number of children: Not on file  . Years of education: Not on file  . Highest education level: Not on file  Occupational History  . Not on file  Tobacco Use  . Smoking status: Current Every Day Smoker    Packs/day: 0.25  . Smokeless tobacco: Never Used  Substance and Sexual Activity  . Alcohol use: No  . Drug use: No  . Sexual activity: Not on file  Other Topics Concern  . Not on file  Social History Narrative  . Not on file   Social Determinants of Health   Financial Resource Strain:   . Difficulty of Paying Living Expenses: Not on file  Food Insecurity:   . Worried About Charity fundraiser in the Last Year: Not on file  . Ran Out of Food in the Last Year: Not on file  Transportation Needs:   . Lack of Transportation (Medical): Not on file  . Lack of Transportation (Non-Medical): Not on file  Physical Activity:   . Days of Exercise per Week: Not on file  . Minutes of Exercise per Session: Not on file  Stress:   . Feeling of Stress : Not on file  Social Connections:   . Frequency of Communication with Friends and Family: Not  on file  . Frequency of Social Gatherings with Friends and Family: Not on file  . Attends Religious Services: Not on file  . Active Member of Clubs or Organizations: Not on file  . Attends Archivist Meetings: Not on file  . Marital Status: Not on file  Intimate Partner Violence:   . Fear of Current or Ex-Partner: Not on file  . Emotionally Abused: Not on file  . Physically Abused: Not on file  . Sexually Abused: Not on file    Family History  Problem Relation Age of Onset  . Heart failure Mother   . Heart failure Father   . Breast cancer Sister 3   Allergies  Allergen Reactions  . Latex Itching and Rash    ? Current Facility-Administered Medications  Medication Dose Route Frequency Provider  Last Rate Last Admin  . 0.9 %  sodium chloride infusion   Intravenous PRN Ivor Costa, MD   Stopped at 12/08/19 1602  . acetaminophen (TYLENOL) tablet 650 mg  650 mg Oral Q6H PRN Ivor Costa, MD   650 mg at 12/08/19 0253  . albuterol (VENTOLIN HFA) 108 (90 Base) MCG/ACT inhaler 2 puff  2 puff Inhalation Q4H PRN Ivor Costa, MD      . aspirin EC tablet 81 mg  81 mg Oral QHS Ivor Costa, MD   81 mg at 12/08/19 0023  . atorvastatin (LIPITOR) tablet 40 mg  40 mg Oral QHS Hall, Scott A, RPH   40 mg at 12/08/19 0023  . ceFEPIme (MAXIPIME) 2 g in sodium chloride 0.9 % 100 mL IVPB  2 g Intravenous Q8H Nazari, Walid A, RPH   Stopped at 12/08/19 1444  . dextromethorphan-guaiFENesin (MUCINEX DM) 30-600 MG per 12 hr tablet 1 tablet  1 tablet Oral BID Ivor Costa, MD      . enalapril (VASOTEC) tablet 2.5 mg  2.5 mg Oral QHS Ivor Costa, MD      . gabapentin (NEURONTIN) capsule 300 mg  300 mg Oral QHS Ivor Costa, MD   300 mg at 12/08/19 0018  . heparin injection 5,000 Units  5,000 Units Subcutaneous Cleophas Dunker, MD   5,000 Units at 12/08/19 1414  . hydrALAZINE (APRESOLINE) tablet 25 mg  25 mg Oral TID PRN Ivor Costa, MD      . insulin aspart (novoLOG) injection 0-9 Units  0-9 Units Subcutaneous Q4H Ivor Costa, MD   1 Units at 12/08/19 0025  . insulin glargine (LANTUS) injection 20 Units  20 Units Subcutaneous QHS Ivor Costa, MD      . magnesium oxide (MAG-OX) tablet 200 mg  200 mg Oral BID Ivor Costa, MD   200 mg at 12/08/19 0957  . metoprolol succinate (TOPROL-XL) 24 hr tablet 25 mg  25 mg Oral QHS Ivor Costa, MD   25 mg at 12/08/19 0018  . nicotine (NICODERM CQ - dosed in mg/24 hours) patch 21 mg  21 mg Transdermal Daily Ivor Costa, MD   21 mg at 12/08/19 0957  . ondansetron (ZOFRAN) injection 4 mg  4 mg Intravenous Q8H PRN Ivor Costa, MD         Abtx:  Anti-infectives (From admission, onward)   Start     Dose/Rate Route Frequency Ordered Stop   12/07/19 2200  ceFEPIme (MAXIPIME) 2 g in sodium chloride 0.9 %  100 mL IVPB     2 g 200 mL/hr over 30 Minutes Intravenous Every 8 hours 12/07/19 1627     12/07/19 1400  ceFEPIme (  MAXIPIME) 1 g in sodium chloride 0.9 % 100 mL IVPB  Status:  Discontinued     1 g 200 mL/hr over 30 Minutes Intravenous Every 8 hours 12/07/19 1309 12/07/19 1627      REVIEW OF SYSTEMS:  Const:  fever, negative chills,  weight loss of 10 pounds  Eyes: negative diplopia or visual changes, negative eye pain ENT: negative coryza, negative sore throat Resp: baseline cough, hemoptysis, dyspnea Cards: negative for chest pain, palpitations, lower extremity edema GU:  frequency, dysuria , rt flank pain, low back pain GI: Negative for  diarrhea, bleeding, constipation Skin: negative for rash and pruritus Heme: negative for easy bruising and gum/nose bleeding MS: has generalized weakness Neurolo:negative for headaches, dizziness, vertigo, memory problems  Psych: negative for feelings of anxiety, depression  Endocrine: has diabetes Allergy/Immunology-as above?  Objective:  VITALS:  BP (!) 146/84 (BP Location: Left Arm)   Pulse 74   Temp 97.9 F (36.6 C) (Oral)   Resp 16   Ht 5\' 3"  (1.6 m)   Wt 73 kg   SpO2 99%   BMI 28.52 kg/m  PHYSICAL EXAM:  General: Alert, cooperative, no distress, appears stated age.  Head: Normocephalic, without obvious abnormality, atraumatic. Eyes: Conjunctivae clear, anicteric sclerae. Pupils are equal ENT Nares normal. No drainage or sinus tenderness. Lips, mucosa, and tongue normal. No Thrush Edentulous- has upper dentures Neck: Supple, symmetrical, no adenopathy, thyroid: non tender no carotid bruit and no JVD. Back: No CVA tenderness. Lungs: Clear to auscultation bilaterally. No Wheezing or Rhonchi. No rales. Heart: Regular rate and rhythm, no murmur, rub or gallop. Abdomen: Soft, non-tender,not distended. Bowel sounds normal. No masses Extremities: atraumatic, no cyanosis. No edema. No clubbing Skin: small lesion rt flank area No  rashes or lesions. Or bruising Lymph: Cervical, supraclavicular normal. Neurologic: Grossly non-focal Pertinent Labs Lab Results CBC    Component Value Date/Time   WBC 5.7 12/08/2019 0439   RBC 4.00 12/08/2019 0439   HGB 11.3 (L) 12/08/2019 0439   HGB 14.6 03/02/2013 2147   HCT 33.6 (L) 12/08/2019 0439   HCT 41.6 03/02/2013 2147   PLT 121 (L) 12/08/2019 0439   PLT 119 (L) 03/02/2013 2147   MCV 84.0 12/08/2019 0439   MCV 87 03/02/2013 2147   MCH 28.3 12/08/2019 0439   MCHC 33.6 12/08/2019 0439   RDW 13.0 12/08/2019 0439   RDW 13.1 03/02/2013 2147   LYMPHSABS 0.8 12/07/2019 1103   MONOABS 0.6 12/07/2019 1103   EOSABS 0.0 12/07/2019 1103   BASOSABS 0.0 12/07/2019 1103    CMP Latest Ref Rng & Units 12/08/2019 12/07/2019 12/04/2019  Glucose 70 - 99 mg/dL 112(H) 89 76  BUN 8 - 23 mg/dL 13 15 23   Creatinine 0.44 - 1.00 mg/dL 0.80 0.80 1.00  Sodium 135 - 145 mmol/L 138 136 133(L)  Potassium 3.5 - 5.1 mmol/L 3.5 3.8 3.9  Chloride 98 - 111 mmol/L 106 102 101  CO2 22 - 32 mmol/L 27 24 23   Calcium 8.9 - 10.3 mg/dL 8.4(L) 8.6(L) 9.1  Total Protein 6.5 - 8.1 g/dL - 7.2 7.3  Total Bilirubin 0.3 - 1.2 mg/dL - 0.7 1.1  Alkaline Phos 38 - 126 U/L - 73 80  AST 15 - 41 U/L - 39 17  ALT 0 - 44 U/L - 49(H) 18      Microbiology: Recent Results (from the past 240 hour(s))  Urine culture     Status: Abnormal   Collection Time: 12/04/19  9:17 AM   Specimen:  Urine, Clean Catch  Result Value Ref Range Status   Specimen Description   Final    URINE, CLEAN CATCH Performed at [facility], [address] [place], [place] [address]    Special Requests   Final    NONE Performed at [facility], [address], [place], [place] [address]    Culture 80,000 COLONIES/mL SERRATIA MARCESCENS (A)  Final   Report Status 12/07/2019 FINAL  Final   Organism ID, Bacteria SERRATIA MARCESCENS (A)  Final      Susceptibility   Serratia marcescens - MIC*    CEFAZOLIN >=64  RESISTANT Resistant     CEFTRIAXONE <=0.25 SENSITIVE Sensitive     CIPROFLOXACIN <=0.25 SENSITIVE Sensitive     GENTAMICIN <=1 SENSITIVE Sensitive     NITROFURANTOIN 256 RESISTANT Resistant     TRIMETH/SULFA <=20 SENSITIVE Sensitive     * 80,000 COLONIES/mL SERRATIA MARCESCENS  Blood Culture (routine x 2)     Status: Abnormal   Collection Time: 12/04/19 12:36 PM   Specimen: BLOOD  Result Value Ref Range Status   Specimen Description   Final    BLOOD BLOOD RIGHT FOREARM Performed at [facility], [address], [place], [place] [address]    Special Requests   Final    BOTTLES DRAWN AEROBIC AND ANAEROBIC Blood Culture adequate volume Performed at [facility], [address] [place], [place] [address]    Culture  Setup Time   Final    GRAM NEGATIVE RODS IN BOTH AEROBIC AND ANAEROBIC BOTTLES CRITICAL VALUE NOTED.  VALUE IS CONSISTENT WITH PREVIOUSLY REPORTED AND CALLED VALUE.    Culture (A)  Final    SERRATIA MARCESCENS SUSCEPTIBILITIES PERFORMED ON PREVIOUS CULTURE WITHIN THE LAST 5 DAYS. Performed at [facility], [address] [place], [place] [address]    Report Status 12/07/2019 FINAL  Final  Blood Culture (routine x 2)     Status: Abnormal   Collection Time: 12/04/19 12:37 PM   Specimen: BLOOD  Result Value Ref Range Status   Specimen Description   Final    BLOOD BLOOD RIGHT FOREARM Performed at [facility], [address] [place], [place] [address]    Special Requests   Final    BOTTLES DRAWN AEROBIC AND ANAEROBIC Blood Culture adequate volume Performed at [facility], [address], [place], [place] [address]    Culture  Setup Time   Final    GRAM NEGATIVE RODS IN BOTH AEROBIC AND ANAEROBIC BOTTLES CRITICAL RESULT CALLED TO, READ BACK BY AND VERIFIED WITH: BRANDY DAVIS AT 1124 12/05/19.PMF    Culture SERRATIA MARCESCENS (A)  Final   Report Status 12/07/2019 FINAL  Final   Organism ID, Bacteria SERRATIA  MARCESCENS  Final      Susceptibility   Serratia marcescens - MIC*    CEFAZOLIN >=64 RESISTANT Resistant     CEFEPIME <=0.12 SENSITIVE Sensitive     CEFTAZIDIME <=1 SENSITIVE Sensitive     CEFTRIAXONE <=0.25 SENSITIVE Sensitive     CIPROFLOXACIN <=0.25 SENSITIVE Sensitive     GENTAMICIN <=1 SENSITIVE Sensitive     TRIMETH/SULFA <=20 SENSITIVE Sensitive     * SERRATIA MARCESCENS  Blood Culture ID Panel (Reflexed)     Status: Abnormal   Collection Time: 12/04/19 12:37 PM  Result Value Ref Range Status   Enterococcus species NOT DETECTED NOT DETECTED Final   Listeria monocytogenes NOT DETECTED NOT DETECTED Final   Staphylococcus species NOT DETECTED NOT DETECTED  Final   Staphylococcus aureus (BCID) NOT DETECTED NOT DETECTED Final   Streptococcus species NOT DETECTED NOT DETECTED Final   Streptococcus agalactiae NOT DETECTED NOT DETECTED Final   Streptococcus pneumoniae NOT DETECTED NOT DETECTED Final   Streptococcus pyogenes NOT DETECTED NOT DETECTED Final   Acinetobacter baumannii NOT DETECTED NOT DETECTED Final   Enterobacteriaceae species DETECTED (A) NOT DETECTED Final    Comment: Enterobacteriaceae represent a large family of gram-negative bacteria, not a single organism. CRITICAL RESULT CALLED TO, READ BACK BY AND VERIFIED WITH: BRANDY DAVIS AT 1124 12/05/19.PMF    Enterobacter cloacae complex NOT DETECTED NOT DETECTED Final   Escherichia coli NOT DETECTED NOT DETECTED Final   Klebsiella oxytoca NOT DETECTED NOT DETECTED Final   Klebsiella pneumoniae NOT DETECTED NOT DETECTED Final   Proteus species NOT DETECTED NOT DETECTED Final   Serratia marcescens DETECTED (A) NOT DETECTED Final    Comment: CRITICAL RESULT CALLED TO, READ BACK BY AND VERIFIED WITH: BRANDY DAVIS AT 1124 12/05/19.PMF    Carbapenem resistance NOT DETECTED NOT DETECTED Final   Haemophilus influenzae NOT DETECTED NOT DETECTED Final   Neisseria meningitidis NOT DETECTED NOT DETECTED Final   Pseudomonas  aeruginosa NOT DETECTED NOT DETECTED Final   Candida albicans NOT DETECTED NOT DETECTED Final   Candida glabrata NOT DETECTED NOT DETECTED Final   Candida krusei NOT DETECTED NOT DETECTED Final   Candida parapsilosis NOT DETECTED NOT DETECTED Final   Candida tropicalis NOT DETECTED NOT DETECTED Final    Comment: Performed at [facility], [address], [place], [place] [address]  Urine culture     Status: Abnormal   Collection Time: 12/04/19  3:13 PM   Specimen: In/Out Cath Urine  Result Value Ref Range Status   Specimen Description   Final    IN/OUT CATH URINE Performed at [facility], [address], [place], [place] [address]    Special Requests   Final    NONE Performed at [facility], [address] [place], [place] [address]    Culture MULTIPLE SPECIES PRESENT, SUGGEST RECOLLECTION (A)  Final   Report Status 12/06/2019 FINAL  Final  Culture, blood (Routine x 2)     Status: None (Preliminary result)   Collection Time: 12/07/19 11:03 AM   Specimen: BLOOD  Result Value Ref Range Status   Specimen Description BLOOD BLOOD LEFT FOREARM  Final   Special Requests   Final    BOTTLES DRAWN AEROBIC AND ANAEROBIC Blood Culture results may not be optimal due to an excessive volume of blood received in culture bottles   Culture   Final    NO GROWTH < 24 HOURS Performed at [facility], [address] [place], [place] [address]    Report Status PENDING  Incomplete  Culture, blood (Routine x 2)     Status: None (Preliminary result)   Collection Time: 12/07/19  1:11 PM   Specimen: BLOOD  Result Value Ref Range Status   Specimen Description BLOOD RIGHT ANTECUBITAL  Final   Special Requests   Final    BOTTLES DRAWN AEROBIC AND ANAEROBIC Blood Culture adequate volume   Culture   Final    NO GROWTH < 24 HOURS Performed at [facility], [address] [place],  [address]    Report Status PENDING  Incomplete  Respiratory  Panel by RT PCR (Flu A&B, Covid) - Nasopharyngeal Swab     Status: None   Collection Time: 12/07/19  1:31 PM   Specimen:  Nasopharyngeal Swab  Result Value Ref Range Status   SARS Coronavirus 2 by RT PCR NEGATIVE NEGATIVE Final    Comment: (NOTE) SARS-CoV-2 target nucleic acids are NOT DETECTED. The SARS-CoV-2 RNA is generally detectable in upper respiratoy specimens during the acute phase of infection. The lowest concentration of SARS-CoV-2 viral copies this assay can detect is 131 copies/mL. A negative result does not preclude SARS-Cov-2 infection and should not be used as the sole basis for treatment or other patient management decisions. A negative result may occur with  improper specimen collection/handling, submission of specimen other than nasopharyngeal swab, presence of viral mutation(s) within the areas targeted by this assay, and inadequate number of viral copies (<131 copies/mL). A negative result must be combined with clinical observations, patient history, and epidemiological information. The expected result is Negative. Fact Sheet for Patients:  PinkCheek.be Fact Sheet for Healthcare Providers:  GravelBags.it This test is not yet ap proved or cleared by the [place] FDA and  has been authorized for detection and/or diagnosis of SARS-CoV-2 by FDA under an Emergency Use Authorization (EUA). This EUA will remain  in effect (meaning this test can be used) for the duration of the COVID-19 declaration under Section 564(b)(1) of the Act, 21 U.S.C. section 360bbb-3(b)(1), unless the authorization is terminated or revoked sooner.    Influenza A by PCR NEGATIVE NEGATIVE Final   Influenza B by PCR NEGATIVE NEGATIVE Final    Comment: (NOTE) The Xpert Xpress SARS-CoV-2/FLU/RSV assay is intended as an aid in  the diagnosis of influenza from Nasopharyngeal swab specimens and  should not be used as a sole basis for  treatment. Nasal washings and  aspirates are unacceptable for Xpert Xpress SARS-CoV-2/FLU/RSV  testing. Fact Sheet for Patients: PinkCheek.be Fact Sheet for Healthcare Providers: GravelBags.it This test is not yet approved or cleared by the [place] FDA and  has been authorized for detection and/or diagnosis of SARS-CoV-2 by  FDA under an Emergency Use Authorization (EUA). This EUA will remain  in effect (meaning this test can be used) for the duration of the  Covid-19 declaration under Section 564(b)(1) of the Act, 21  U.S.C. section 360bbb-3(b)(1), unless the authorization is  terminated or revoked. Performed at [facility], [address] [place], [place] [address]     IMAGING RESULTS: CT cscan from 12/04/19 reviewed Rt pyelonephritis I have personally reviewed the films ? Impression/Recommendation ? ?Serratia marcescens bacteremia and Serratia marcescens pyelonephritis.  Patient is currently on cefepime.  It is sensitive to quinolone on discharge can be sent on quinolone for at least 10 days. Serratia is an unusual organism to cause urinary tract infection and  patient has undergone any recent procedures. 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 GI tract colonization is possible Will get a post void bladder scan to make sure there is no residue H/o renal stones  Quinolones are susceptible and hence on discharge she can either get levaqun 750mg  once a day or cipro Bid for 8 more  days ?  DM- not well controlled on 4 different meds at home- ozempic, lantus, metformin and Jardiance  HLD on atorvastatin  SVT on metoprolol  Discussed the management with the patient  ___________________________________________________ Discussed with patient, requesting provider Note:  This document was prepared using Dragon voice recognition software and may include unintentional dictation errors.

## 2019-12-09 DIAGNOSIS — A498 Other bacterial infections of unspecified site: Secondary | ICD-10-CM

## 2019-12-09 DIAGNOSIS — E1169 Type 2 diabetes mellitus with other specified complication: Secondary | ICD-10-CM

## 2019-12-09 DIAGNOSIS — E785 Hyperlipidemia, unspecified: Secondary | ICD-10-CM

## 2019-12-09 LAB — CBC
HCT: 36 % (ref 36.0–46.0)
Hemoglobin: 11.9 g/dL — ABNORMAL LOW (ref 12.0–15.0)
MCH: 27.7 pg (ref 26.0–34.0)
MCHC: 33.1 g/dL (ref 30.0–36.0)
MCV: 83.7 fL (ref 80.0–100.0)
Platelets: 145 10*3/uL — ABNORMAL LOW (ref 150–400)
RBC: 4.3 MIL/uL (ref 3.87–5.11)
RDW: 13 % (ref 11.5–15.5)
WBC: 6.3 10*3/uL (ref 4.0–10.5)
nRBC: 0 % (ref 0.0–0.2)

## 2019-12-09 LAB — GLUCOSE, CAPILLARY
Glucose-Capillary: 97 mg/dL (ref 70–99)
Glucose-Capillary: 89 mg/dL (ref 70–99)
Glucose-Capillary: 86 mg/dL (ref 70–99)

## 2019-12-09 MED ORDER — LANTUS SOLOSTAR 100 UNIT/ML ~~LOC~~ SOPN: 20.0000 [IU] | PEN_INJECTOR | Freq: Every day | SUBCUTANEOUS | 11 refills | Status: AC

## 2019-12-09 MED ORDER — NICOTINE 21 MG/24HR TD PT24: MEDICATED_PATCH | TRANSDERMAL | 0 refills | Status: DC

## 2019-12-09 MED ORDER — LEVOFLOXACIN 500 MG PO TABS: 500.0000 mg | ORAL_TABLET | Freq: Every day | ORAL | 0 refills | Status: DC

## 2019-12-09 MED ORDER — LEVOFLOXACIN 750 MG PO TABS: 750.0000 mg | ORAL_TABLET | Freq: Every day | ORAL | 0 refills | Status: AC

## 2019-12-09 NOTE — Progress Notes (Signed)
Patient ID: Dawn Farrell was admitted to the Hospital on 12/07/2019 and Discharged  12/09/2019 and should be excused from work/school   for 7 days starting 12/07/2019 , may return to work/school without any restrictions on 12/14/2019.  Loletha Grayer M.D on 12/09/2019,at 8:12 AM  [facility] [place] [facility]

## 2019-12-09 NOTE — Discharge Instructions (Signed)
Pyelonephritis, Adult  Pyelonephritis is an infection that occurs in the kidney. The kidneys are organs that help clean the blood by moving waste out of the blood and into the pee (urine). This infection can happen quickly, or it can last for a long time. In most cases, it clears up with treatment and does not cause other problems. What are the causes? This condition may be caused by:  Germs (bacteria) going from the bladder up to the kidney. This may happen after having a bladder infection.  Germs going from the blood to the kidney. What increases the risk? This condition is more likely to develop in:  Pregnant women.  Older people.  People who have any of these conditions: ? Diabetes. ? Inflammation of the prostate gland (prostatitis), in males. ? Kidney stones or bladder stones. ? Other problems with the kidney or the parts of your body that carry pee from the kidneys to the bladder (ureters). ? Cancer.  People who have a small, thin tube (catheter) placed in the bladder.  People who are sexually active.  Women who use a medicine that kills sperm (spermicide) to prevent pregnancy.  People who have had a prior urinary tract infection (UTI). What are the signs or symptoms? Symptoms of this condition include:  Peeing often.  A strong urge to pee right away.  Burning or stinging when peeing.  Belly pain.  Back pain.  Pain in the side (flank area).  Fever or chills.  Blood in the pee, or dark pee.  Feeling sick to your stomach (nauseous) or throwing up (vomiting). How is this treated? This condition may be treated by:  Taking antibiotic medicines by mouth (orally).  Drinking enough fluids. If the infection is bad, you may need to stay in the hospital. You may be given antibiotics and fluids that are put directly into a vein through an IV tube. In some cases, other treatments may be needed. Follow these instructions at home: Medicines  Take your antibiotic  medicine as told by your doctor. Do not stop taking the antibiotic even if you start to feel better.  Take over-the-counter and prescription medicines only as told by your doctor. General instructions   Drink enough fluid to keep your pee pale yellow.  Avoid caffeine, tea, and carbonated drinks.  Pee (urinate) often. Avoid holding in pee for long periods of time.  Pee before and after sex.  After pooping (having a bowel movement), women should wipe from front to back. Use each tissue only once.  Keep all follow-up visits as told by your doctor. This is important. Contact a doctor if:  You do not feel better after 2 days.  Your symptoms get worse.  You have a fever. Get help right away if:  You cannot take your medicine or drink fluids as told.  You have chills and shaking.  You throw up.  You have very bad pain in your side or back.  You feel very weak or you pass out (faint). Summary  Pyelonephritis is an infection that occurs in the kidney.  In most cases, this infection clears up with treatment and does not cause other problems.  Take your antibiotic medicine as told by your doctor. Do not stop taking the antibiotic even if you start to feel better.  Drink enough fluid to keep your pee pale yellow. This information is not intended to replace advice given to you by your health care provider. Make sure you discuss any questions you have with  your health care provider. Document Revised: 08/12/2018 Document Reviewed: 08/12/2018 Elsevier Patient Education  2020 [employer]  

## 2019-12-09 NOTE — Progress Notes (Signed)
Dawn Farrell to be D/C'd Home per MD order.  Discussed prescriptions and follow up appointments with the patient. Prescriptions given to patient, medication list explained in detail. Pt verbalized understanding.  Allergies as of 12/09/2019      Reactions   Latex Itching, Rash      Medication List    STOP taking these medications   cephALEXin 500 MG capsule Commonly known as: KEFLEX     TAKE these medications   aspirin EC 81 MG tablet Take 81 mg by mouth at bedtime.   atorvastatin 40 MG tablet Commonly known as: LIPITOR Take 40 mg by mouth at bedtime.   enalapril 2.5 MG tablet Commonly known as: VASOTEC Take 2.5 mg by mouth at bedtime.   gabapentin 300 MG capsule Commonly known as: NEURONTIN Take 300 mg by mouth at bedtime.   Jardiance 25 MG Tabs tablet Generic drug: empagliflozin Take 25 mg by mouth daily.   Lantus SoloStar 100 UNIT/ML Solostar Pen Generic drug: Insulin Glargine Inject 20 Units into the skin at bedtime. What changed: how much to take   levofloxacin 750 MG tablet Commonly known as: Levaquin Take 1 tablet (750 mg total) by mouth daily for 8 days.   Magnesium Oxide 200 MG Tabs Take 200 mg by mouth 2 (two) times daily.   metFORMIN 500 MG tablet Commonly known as: GLUCOPHAGE Take 500 mg by mouth 2 (two) times daily with a meal.   metoprolol succinate 25 MG 24 hr tablet Commonly known as: TOPROL-XL Take 25 mg by mouth at bedtime.   nicotine 21 mg/24hr patch Commonly known as: NICODERM CQ - dosed in mg/24 hours One patch chest wall daily (okay to substitute generic)   ondansetron 4 MG tablet Commonly known as: Zofran Take 1 tablet (4 mg total) by mouth daily as needed.   Ozempic (0.25 or 0.5 MG/DOSE) 2 MG/1.5ML Sopn Generic drug: Semaglutide(0.25 or 0.5MG /DOS) Inject 0.375 mg into the skin once a week.       Vitals:   12/09/19 0337 12/09/19 0756  BP: 128/70 (!) 148/87  Pulse: 67 70  Resp: 18 18  Temp: 98.1 F (36.7 C) (!) 97.4 F  (36.3 C)  SpO2: 98% 99%    Skin clean, dry and intact without evidence of skin break down, no evidence of skin tears noted. IV catheter discontinued intact. Site without signs and symptoms of complications. Dressing and pressure applied. Pt denies pain at this time. No complaints noted.  An After Visit Summary was printed and given to the patient. Patient D/C home via private auto.  Dawn Farrell 12/09/2019 11:32 AM

## 2019-12-09 NOTE — Discharge Summary (Signed)
Dawn Farrell NAME: Dawn Farrell    MR#:  [id number]  DATE OF BIRTH:  [birth year]  DATE OF ADMISSION:  12/07/2019 ADMITTING PHYSICIAN: Ivor Costa, MD  DATE OF DISCHARGE: 12/09/2019  2:16 PM  PRIMARY CARE PHYSICIAN: [name], MD    ADMISSION DIAGNOSIS:  Pyelonephritis [N12] Acute pyelonephritis [N10]  DISCHARGE DIAGNOSIS:  Principal Problem:   Acute pyelonephritis Active Problems:   Thrombocytopenia (HCC)   COPD (chronic obstructive pulmonary disease) (HCC)   Diabetes mellitus without complication (HCC)   HTN (hypertension)   HLD (hyperlipidemia)   Tobacco abuse   Bacteremia due to Gram-negative bacteria   SECONDARY DIAGNOSIS:   Past Medical History:  Diagnosis Date  . Cancer ([place])    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  . COPD (chronic obstructive pulmonary disease) ([place])   . Diabetes mellitus without complication ([place])   . HTN (hypertension)   . Renal calculi     HOSPITAL COURSE:   1.  Acute pyelonephritis with bacteremia secondary to Serratia.  The patient was placed on aggressive antibiotics on presentation and will be switched over to Levaquin upon being discharged home.  Patient was seen in consultation by Dr. Steva Ready infectious disease.  Patient initially came to the emergency room on 12/04/2019 and sent home and called back because she had positive blood cultures.  She was admitted on 12/07/2019 and feeling better upon discharge on 12/09/2019. 2.  Chronic thrombocytopenia 3.  Type 2 diabetes mellitus.  Her Lantus dose decreased down to 20 units daily with sugars being on the lower side.  Can go back on the oral medications and may be able to titrate down on some of these medications as outpatient. 4.  Essential hypertension on enalapril and metoprolol 5.  COPD.  Respiratory status stable 6.  Hyperlipidemia on Lipitor 7.  Tobacco abuse on nicotine patch  DISCHARGE CONDITIONS:   Satisfactory  CONSULTS OBTAINED:   Infectious disease  DRUG ALLERGIES:   Allergies  Allergen Reactions  . Latex Itching and Rash    DISCHARGE MEDICATIONS:   Allergies as of 12/09/2019      Reactions   Latex Itching, Rash      Medication List    STOP taking these medications   cephALEXin 500 MG capsule Commonly known as: KEFLEX     TAKE these medications   aspirin EC 81 MG tablet Take 81 mg by mouth at bedtime.   atorvastatin 40 MG tablet Commonly known as: LIPITOR Take 40 mg by mouth at bedtime.   enalapril 2.5 MG tablet Commonly known as: VASOTEC Take 2.5 mg by mouth at bedtime.   gabapentin 300 MG capsule Commonly known as: NEURONTIN Take 300 mg by mouth at bedtime.   Jardiance 25 MG Tabs tablet Generic drug: empagliflozin Take 25 mg by mouth daily.   Lantus SoloStar 100 UNIT/ML Solostar Pen Generic drug: Insulin Glargine Inject 20 Units into the skin at bedtime. What changed: how much to take   levofloxacin 750 MG tablet Commonly known as: Levaquin Take 1 tablet (750 mg total) by mouth daily for 8 days.   Magnesium Oxide 200 MG Tabs Take 200 mg by mouth 2 (two) times daily.   metFORMIN 500 MG tablet Commonly known as: GLUCOPHAGE Take 500 mg by mouth 2 (two) times daily with a meal.   metoprolol succinate 25 MG 24 hr tablet Commonly known as: TOPROL-XL Take 25 mg by mouth at bedtime.   nicotine 21 mg/24hr patch Commonly known as: NICODERM  CQ - dosed in mg/24 hours One patch chest wall daily (okay to substitute generic)   ondansetron 4 MG tablet Commonly known as: Zofran Take 1 tablet (4 mg total) by mouth daily as needed.   Ozempic (0.25 or 0.5 MG/DOSE) 2 MG/1.5ML Sopn Generic drug: Semaglutide(0.25 or 0.5MG /DOS) Inject 0.375 mg into the skin once a week.        DISCHARGE INSTRUCTIONS:   Follow-up PMD 5 days  If you experience worsening of your admission symptoms, develop shortness of breath, life threatening emergency, suicidal or homicidal thoughts you must seek  medical attention immediately by calling 911 or calling your MD immediately  if symptoms less severe.  You Must read complete instructions/literature along with all the possible adverse reactions/side effects for all the Medicines you take and that have been prescribed to you. Take any new Medicines after you have completely understood and accept all the possible adverse reactions/side effects.   Please note  You were cared for by a hospitalist during your hospital stay. If you have any questions about your discharge medications or the care you received while you were in the hospital after you are discharged, you can call the unit and asked to speak with the hospitalist on call if the hospitalist that took care of you is not available. Once you are discharged, your primary care physician will handle any further medical issues. Please note that NO REFILLS for any discharge medications will be authorized once you are discharged, as it is imperative that you return to your primary care physician (or establish a relationship with a primary care physician if you do not have one) for your aftercare needs so that they can reassess your need for medications and monitor your lab values.    Today   CHIEF COMPLAINT:   Chief Complaint  Patient presents with  . Abnormal Lab    HISTORY OF PRESENT ILLNESS:  Dawn Farrell  is a 64 y.o. female told to come back in with positive blood culture   VITAL SIGNS:  Blood pressure (!) 148/87, pulse 70, temperature (!) 97.4 F (36.3 C), temperature source Axillary, resp. rate 18, height 5\' 3"  (1.6 m), weight 73 kg, SpO2 99 %.  I/O:    Intake/Output Summary (Last 24 hours) at 12/09/2019 1424 Last data filed at 12/09/2019 0900 Gross per 24 hour  Intake 593.12 ml  Output 2000 ml  Net -1406.88 ml    PHYSICAL EXAMINATION:  GENERAL:  64 y.o.-year-old patient lying in the bed with no acute distress.  EYES: Pupils equal, round, reactive to light and accommodation.  No scleral icterus. Extraocular muscles intact.  HEENT: Head atraumatic, normocephalic. Oropharynx and nasopharynx clear.  NECK:  Supple, no jugular venous distention. No thyroid enlargement, no tenderness.  LUNGS: Normal breath sounds bilaterally, no wheezing, rales,rhonchi or crepitation. No use of accessory muscles of respiration.  CARDIOVASCULAR: S1, S2 normal. No murmurs, rubs, or gallops.  ABDOMEN: Soft, non-tender, non-distended. Bowel sounds present. No organomegaly or mass.  EXTREMITIES: No pedal edema, cyanosis, or clubbing.  NEUROLOGIC: Cranial nerves II through XII are intact. Muscle strength 5/5 in all extremities. Sensation intact. Gait not checked.  PSYCHIATRIC: The patient is alert and oriented x 3.  SKIN: No obvious rash, lesion, or ulcer.   DATA REVIEW:   CBC Recent Labs  Lab 12/09/19 0551  WBC 6.3  HGB 11.9*  HCT 36.0  PLT 145*    Chemistries  Recent Labs  Lab 12/07/19 1103 12/07/19 1103 12/08/19 0439  NA 136   < >  138  K 3.8   < > 3.5  CL 102   < > 106  CO2 24   < > 27  GLUCOSE 89   < > 112*  BUN 15   < > 13  CREATININE 0.80   < > 0.80  CALCIUM 8.6*   < > 8.4*  AST 39  --   --   ALT 49*  --   --   ALKPHOS 73  --   --   BILITOT 0.7  --   --    < > = values in this interval not displayed.    Microbiology Results  Results for orders placed or performed during the hospital encounter of 12/07/19  Culture, blood (Routine x 2)     Status: None (Preliminary result)   Collection Time: 12/07/19 11:03 AM   Specimen: BLOOD  Result Value Ref Range Status   Specimen Description BLOOD BLOOD LEFT FOREARM  Final   Special Requests   Final    BOTTLES DRAWN AEROBIC AND ANAEROBIC Blood Culture results may not be optimal due to an excessive volume of blood received in culture bottles   Culture   Final    NO GROWTH 2 DAYS Performed at [facility], [address] [place], [place] [address]    Report Status PENDING  Incomplete  Culture, blood (Routine  x 2)     Status: None (Preliminary result)   Collection Time: 12/07/19  1:11 PM   Specimen: BLOOD  Result Value Ref Range Status   Specimen Description BLOOD RIGHT ANTECUBITAL  Final   Special Requests   Final    BOTTLES DRAWN AEROBIC AND ANAEROBIC Blood Culture adequate volume   Culture   Final    NO GROWTH 2 DAYS Performed at [facility], [address] [place], [place] [address]    Report Status PENDING  Incomplete  Respiratory Panel by RT PCR (Flu A&B, Covid) - Nasopharyngeal Swab     Status: None   Collection Time: 12/07/19  1:31 PM   Specimen: Nasopharyngeal Swab  Result Value Ref Range Status   SARS Coronavirus 2 by RT PCR NEGATIVE NEGATIVE Final    Comment: (NOTE) SARS-CoV-2 target nucleic acids are NOT DETECTED. The SARS-CoV-2 RNA is generally detectable in upper respiratoy specimens during the acute phase of infection. The lowest concentration of SARS-CoV-2 viral copies this assay can detect is 131 copies/mL. A negative result does not preclude SARS-Cov-2 infection and should not be used as the sole basis for treatment or other patient management decisions. A negative result may occur with  improper specimen collection/handling, submission of specimen other than nasopharyngeal swab, presence of viral mutation(s) within the areas targeted by this assay, and inadequate number of viral copies (<131 copies/mL). A negative result must be combined with clinical observations, patient history, and epidemiological information. The expected result is Negative. Fact Sheet for Patients:  PinkCheek.be Fact Sheet for Healthcare Providers:  GravelBags.it This test is not yet ap proved or cleared by the [place] FDA and  has been authorized for detection and/or diagnosis of SARS-CoV-2 by FDA under an Emergency Use Authorization (EUA). This EUA will remain  in effect (meaning this test can be used) for the  duration of the COVID-19 declaration under Section 564(b)(1) of the Act, 21 U.S.C. section 360bbb-3(b)(1), unless the authorization is terminated or revoked sooner.    Influenza A by PCR NEGATIVE NEGATIVE Final   Influenza B by PCR NEGATIVE NEGATIVE Final    Comment: (  NOTE) The Xpert Xpress SARS-CoV-2/FLU/RSV assay is intended as an aid in  the diagnosis of influenza from Nasopharyngeal swab specimens and  should not be used as a sole basis for treatment. Nasal washings and  aspirates are unacceptable for Xpert Xpress SARS-CoV-2/FLU/RSV  testing. Fact Sheet for Patients: PinkCheek.be Fact Sheet for Healthcare Providers: GravelBags.it This test is not yet approved or cleared by the [place] FDA and  has been authorized for detection and/or diagnosis of SARS-CoV-2 by  FDA under an Emergency Use Authorization (EUA). This EUA will remain  in effect (meaning this test can be used) for the duration of the  Covid-19 declaration under Section 564(b)(1) of the Act, 21  U.S.C. section 360bbb-3(b)(1), unless the authorization is  terminated or revoked. Performed at [facility], [address], [place], [place] [address]      Management plans discussed with the patient, and she is in agreement.  CODE STATUS:     Code Status Orders  (From admission, onward)         Start     Ordered   12/08/19 0000  Full code  Continuous     12/07/19 2359        Code Status History    This patient has a current code status but no historical code status.   Advance Care Planning Activity    Advance Directive Documentation     Most Recent Value  Type of Advance Directive  Healthcare Power of Attorney, Living will  Pre-existing out of facility DNR order (yellow form or pink MOST form)  --  "MOST" Form in Place?  --      TOTAL TIME TAKING CARE OF THIS PATIENT: 34 minutes.    Loletha Grayer M.D on 12/09/2019 at 2:24  PM  Between 7am to 6pm - Pager - [phone]  After 6pm go to [email or web address] - password [email or web address]  [facility]  CC: Primary care physician; [name], MD

## 2019-12-12 LAB — CULTURE, BLOOD (ROUTINE X 2)
Culture: NO GROWTH
Culture: NO GROWTH
Special Requests: ADEQUATE

## 2020-10-11 ENCOUNTER — Other Ambulatory Visit: Payer: Self-pay | Admitting: Surgery

## 2020-10-12 ENCOUNTER — Inpatient Hospital Stay: Admission: RE | Admit: 2020-10-12 | Payer: Self-pay | Source: Ambulatory Visit

## 2020-10-17 ENCOUNTER — Encounter
Admission: RE | Admit: 2020-10-17 | Discharge: 2020-10-17 | Disposition: A | Discharge status: Home / Self Care | Payer: PRIVATE HEALTH INSURANCE | Source: Ambulatory Visit | Attending: Surgery | Admitting: Surgery

## 2020-10-17 ENCOUNTER — Other Ambulatory Visit
Admission: RE | Admit: 2020-10-17 | Discharge: 2020-10-17 | Disposition: A | Discharge status: Home / Self Care | Payer: PRIVATE HEALTH INSURANCE | Source: Ambulatory Visit | Attending: Surgery | Admitting: Surgery

## 2020-10-17 ENCOUNTER — Other Ambulatory Visit: Payer: Self-pay

## 2020-10-17 DIAGNOSIS — I10 Essential (primary) hypertension: Secondary | ICD-10-CM | POA: Diagnosis not present

## 2020-10-17 DIAGNOSIS — Z01818 Encounter for other preprocedural examination: Secondary | ICD-10-CM | POA: Insufficient documentation

## 2020-10-17 DIAGNOSIS — Z01810 Encounter for preprocedural cardiovascular examination: Secondary | ICD-10-CM | POA: Diagnosis not present

## 2020-10-17 DIAGNOSIS — Z20822 Contact with and (suspected) exposure to covid-19: Secondary | ICD-10-CM | POA: Insufficient documentation

## 2020-10-17 HISTORY — DX: Personal history of urinary calculi: Z87.442

## 2020-10-17 LAB — BASIC METABOLIC PANEL
Anion gap: 8 (ref 5–15)
BUN: 18 mg/dL (ref 8–23)
CO2: 25 mmol/L (ref 22–32)
Calcium: 9.5 mg/dL (ref 8.9–10.3)
Chloride: 102 mmol/L (ref 98–111)
Creatinine, Ser: 0.67 mg/dL (ref 0.44–1.00)
GFR, Estimated: >60 mL/min (ref >60)
Glucose, Bld: 86 mg/dL (ref 70–99)
Potassium: 4.3 mmol/L (ref 3.5–5.1)
Sodium: 135 mmol/L (ref 135–145)

## 2020-10-17 NOTE — Patient Instructions (Signed)
Your procedure is scheduled on: Wednesday October 19, 2020. Report to Day Surgery inside [facility] 2nd floor (stop by Registration on first floor). To find out your arrival time please call [phone] between 1PM - 3PM on Tuesday October 18, 2020.  Remember: Instructions that are not followed completely may result in serious medical risk,  up to and including death, or upon the discretion of your surgeon and anesthesiologist your  surgery may need to be rescheduled.     _X__ 1. Do not eat food after midnight the night before your procedure.                 No chewing gum or hard candies. You may drink clear liquids up to 2 hours                 before you are scheduled to arrive for your surgery- DO not drink clear                 liquids within 2 hours of the start of your surgery.                 Clear Liquids include:  water, apple juice without pulp, clear Gatorade, G2 or                  Gatorade Zero (avoid Red/Purple/Blue), Black Coffee or Tea (Do not add                 anything to coffee or tea).  __X__2.  On the morning of surgery brush your teeth with toothpaste and water, you                may rinse your mouth with mouthwash if you wish.  Do not swallow any toothpaste of mouthwash.     _X__ 3.  No Alcohol for 24 hours before or after surgery.   _X__ 4.  Do Not Smoke or use e-cigarettes For 24 Hours Prior to Your Surgery.                 Do not use any chewable tobacco products for at least 6 hours prior to                 Surgery.  _X__  5.  Do not use any recreational drugs (marijuana, cocaine, heroin, ecstasy, MDMA or other)                For at least one week prior to your surgery.  Combination of these drugs with anesthesia                May have life threatening results.  __X__ 6.  Notify your doctor if there is any change in your medical condition      (cold, fever, infections).     Do not wear jewelry, make-up, hairpins, clips or  nail polish. Do not wear lotions, powders, or perfumes. You may wear deodorant. Do not shave 48 hours prior to surgery. Men may shave face and neck. Do not bring valuables to the hospital.    [facility] is not responsible for any belongings or valuables.  Contacts, dentures or bridgework may not be worn into surgery. Leave your suitcase in the car. After surgery it may be brought to your room. For patients admitted to the hospital, discharge time is determined by your treatment team.   Patients discharged the day of surgery will not be allowed to drive home.  Make arrangements for someone to be with you for the first 24 hours of your Same Day Discharge.   __X__ Take these medicines the morning of surgery with A SIP OF WATER:    1. None    ____ Fleet Enema (as directed)   __X__ Use CHG Soap (or wipes) as directed  ____ Use Benzoyl Peroxide Gel as instructed  ____ Use inhalers on the day of surgery  __x__ Stop metFORMIN (GLUCOPHAGE) 500 MG  2 days prior to surgery     __x__ Take 1/2 of usual insulin dose the night before surgery. No insulin the morning          of surgery. LANTUS SOLOSTAR 100 UNIT/ML   __x__ Stop Anti-inflammatories such as Ibuprofen, Aleve, Advil, naproxen, aspirin and or BC powders.    __x__ Stop supplements until after surgery.    __x__ DO not start any herbal supplements before your procedure.    If you have any questions regarding your pre-procedure instructions,  Please call Pre-admit Testing at [phone].

## 2020-10-18 ENCOUNTER — Other Ambulatory Visit: Payer: Self-pay

## 2020-10-18 LAB — SARS CORONAVIRUS 2 (TAT 6-24 HRS): SARS Coronavirus 2: NEGATIVE

## 2020-10-19 ENCOUNTER — Ambulatory Visit: Payer: PRIVATE HEALTH INSURANCE | Admitting: Urgent Care

## 2020-10-19 ENCOUNTER — Encounter
Admission: RE | Disposition: A | Discharge status: Home / Self Care | Payer: Self-pay | Source: Home / Self Care | Attending: Surgery

## 2020-10-19 ENCOUNTER — Encounter: Payer: Self-pay | Admitting: Surgery

## 2020-10-19 ENCOUNTER — Other Ambulatory Visit: Payer: Self-pay

## 2020-10-19 ENCOUNTER — Ambulatory Visit
Admission: RE | Admit: 2020-10-19 | Discharge: 2020-10-19 | Disposition: A | Discharge status: Home / Self Care | Payer: PRIVATE HEALTH INSURANCE | Attending: Surgery | Admitting: Surgery

## 2020-10-19 DIAGNOSIS — Z91040 Latex allergy status: Secondary | ICD-10-CM | POA: Insufficient documentation

## 2020-10-19 DIAGNOSIS — Z9049 Acquired absence of other specified parts of digestive tract: Secondary | ICD-10-CM | POA: Diagnosis not present

## 2020-10-19 DIAGNOSIS — G5602 Carpal tunnel syndrome, left upper limb: Secondary | ICD-10-CM | POA: Insufficient documentation

## 2020-10-19 DIAGNOSIS — S52502D Unspecified fracture of the lower end of left radius, subsequent encounter for closed fracture with routine healing: Secondary | ICD-10-CM | POA: Insufficient documentation

## 2020-10-19 DIAGNOSIS — Z86010 Personal history of colonic polyps: Secondary | ICD-10-CM | POA: Diagnosis not present

## 2020-10-19 DIAGNOSIS — Z87891 Personal history of nicotine dependence: Secondary | ICD-10-CM | POA: Diagnosis not present

## 2020-10-19 DIAGNOSIS — Z85828 Personal history of other malignant neoplasm of skin: Secondary | ICD-10-CM | POA: Diagnosis not present

## 2020-10-19 DIAGNOSIS — Z90711 Acquired absence of uterus with remaining cervical stump: Secondary | ICD-10-CM | POA: Diagnosis not present

## 2020-10-19 DIAGNOSIS — Z79899 Other long term (current) drug therapy: Secondary | ICD-10-CM | POA: Diagnosis not present

## 2020-10-19 DIAGNOSIS — Z7982 Long term (current) use of aspirin: Secondary | ICD-10-CM | POA: Diagnosis not present

## 2020-10-19 DIAGNOSIS — Z794 Long term (current) use of insulin: Secondary | ICD-10-CM | POA: Diagnosis not present

## 2020-10-19 DIAGNOSIS — W11XXXD Fall on and from ladder, subsequent encounter: Secondary | ICD-10-CM | POA: Diagnosis not present

## 2020-10-19 HISTORY — PX: HARDWARE REMOVAL: SHX979

## 2020-10-19 LAB — GLUCOSE, CAPILLARY
Glucose-Capillary: 73 mg/dL (ref 70–99)
Glucose-Capillary: 102 mg/dL — ABNORMAL HIGH (ref 70–99)
Glucose-Capillary: 58 mg/dL — ABNORMAL LOW (ref 70–99)
Glucose-Capillary: 84 mg/dL (ref 70–99)

## 2020-10-19 SURGERY — REMOVAL, HARDWARE
Anesthesia: General | Site: Wrist | Laterality: Left

## 2020-10-19 MED ORDER — ONDANSETRON HCL 4 MG/2ML IJ SOLN: 4.0000 mg | Freq: Four times a day (QID) | INTRAMUSCULAR | Status: DC | PRN

## 2020-10-19 MED ORDER — HYDROCODONE-ACETAMINOPHEN 5-325 MG PO TABS: 1.0000 | ORAL_TABLET | ORAL | Status: DC | PRN

## 2020-10-19 MED ORDER — FENTANYL CITRATE (PF) 100 MCG/2ML IJ SOLN
INTRAMUSCULAR | Status: AC
  Administered 2020-10-19: 17:00:00 25 ug via INTRAVENOUS
  Filled 2020-10-19: qty 2

## 2020-10-19 MED ORDER — BUPIVACAINE HCL (PF) 0.5 % IJ SOLN
INTRAMUSCULAR | Status: DC | PRN
  Administered 2020-10-19: 10 mL

## 2020-10-19 MED ORDER — HYDROCODONE-ACETAMINOPHEN 5-325 MG PO TABS
ORAL_TABLET | ORAL | Status: AC
  Administered 2020-10-19: 17:00:00 1 via ORAL
  Filled 2020-10-19: qty 2

## 2020-10-19 MED ORDER — ACETAMINOPHEN 10 MG/ML IV SOLN
INTRAVENOUS | Status: DC | PRN
  Administered 2020-10-19: 1000 mg via INTRAVENOUS

## 2020-10-19 MED ORDER — GLYCOPYRROLATE 0.2 MG/ML IJ SOLN
INTRAMUSCULAR | Status: DC | PRN
  Administered 2020-10-19: .2 mg via INTRAVENOUS

## 2020-10-19 MED ORDER — MIDAZOLAM HCL 2 MG/2ML IJ SOLN
INTRAMUSCULAR | Status: DC | PRN
  Administered 2020-10-19: 2 mg via INTRAVENOUS

## 2020-10-19 MED ORDER — BUPIVACAINE HCL (PF) 0.5 % IJ SOLN
INTRAMUSCULAR | Status: AC
  Filled 2020-10-19: qty 30

## 2020-10-19 MED ORDER — ONDANSETRON HCL 4 MG/2ML IJ SOLN
INTRAMUSCULAR | Status: DC | PRN
  Administered 2020-10-19 (×2): 4 mg via INTRAVENOUS

## 2020-10-19 MED ORDER — ONDANSETRON 4 MG PO TBDP
ORAL_TABLET | ORAL | Status: AC
  Filled 2020-10-19: qty 1

## 2020-10-19 MED ORDER — ORAL CARE MOUTH RINSE: 15.0000 mL | Freq: Once | OROMUCOSAL | Status: AC

## 2020-10-19 MED ORDER — POTASSIUM CHLORIDE IN NACL 20-0.9 MEQ/L-% IV SOLN
INTRAVENOUS | Status: DC
  Filled 2020-10-19 (×5): qty 1000

## 2020-10-19 MED ORDER — CHLORHEXIDINE GLUCONATE 0.12 % MT SOLN: 15.0000 mL | Freq: Once | OROMUCOSAL | Status: AC

## 2020-10-19 MED ORDER — FENTANYL CITRATE (PF) 100 MCG/2ML IJ SOLN
INTRAMUSCULAR | Status: DC | PRN
  Administered 2020-10-19: 25 ug via INTRAVENOUS
  Administered 2020-10-19: 50 ug via INTRAVENOUS
  Administered 2020-10-19: 25 ug via INTRAVENOUS

## 2020-10-19 MED ORDER — MIDAZOLAM HCL 2 MG/2ML IJ SOLN
INTRAMUSCULAR | Status: AC
  Filled 2020-10-19: qty 2

## 2020-10-19 MED ORDER — CEFAZOLIN SODIUM-DEXTROSE 2-4 GM/100ML-% IV SOLN
2.0000 g | INTRAVENOUS | Status: AC
  Administered 2020-10-19: 15:00:00 2 g via INTRAVENOUS
  Filled 2020-10-19: qty 100

## 2020-10-19 MED ORDER — HYDROCODONE-ACETAMINOPHEN 7.5-325 MG PO TABS: 1.0000 | ORAL_TABLET | Freq: Four times a day (QID) | ORAL | 0 refills | Status: DC | PRN

## 2020-10-19 MED ORDER — ROCURONIUM BROMIDE 10 MG/ML (PF) SYRINGE
PREFILLED_SYRINGE | INTRAVENOUS | Status: AC
  Filled 2020-10-19: qty 10

## 2020-10-19 MED ORDER — ONDANSETRON HCL 4 MG/2ML IJ SOLN
INTRAMUSCULAR | Status: AC
  Filled 2020-10-19: qty 8

## 2020-10-19 MED ORDER — LIDOCAINE HCL (CARDIAC) PF 100 MG/5ML IV SOSY
PREFILLED_SYRINGE | INTRAVENOUS | Status: DC | PRN
  Administered 2020-10-19: 100 mg via INTRAVENOUS

## 2020-10-19 MED ORDER — PROPOFOL 10 MG/ML IV BOLUS
INTRAVENOUS | Status: DC | PRN
  Administered 2020-10-19: 150 mg via INTRAVENOUS
  Administered 2020-10-19: 30 mg via INTRAVENOUS

## 2020-10-19 MED ORDER — DEXAMETHASONE SODIUM PHOSPHATE 10 MG/ML IJ SOLN
INTRAMUSCULAR | Status: DC | PRN
  Administered 2020-10-19: 10 mg via INTRAVENOUS

## 2020-10-19 MED ORDER — DEXTROSE 50 % IV SOLN: 0.2500 | Freq: Once | INTRAVENOUS | Status: AC

## 2020-10-19 MED ORDER — KETAMINE HCL 50 MG/5ML IJ SOSY
PREFILLED_SYRINGE | INTRAMUSCULAR | Status: AC
  Filled 2020-10-19: qty 5

## 2020-10-19 MED ORDER — HYDROMORPHONE HCL 1 MG/ML IJ SOLN
INTRAMUSCULAR | Status: AC
  Filled 2020-10-19: qty 1

## 2020-10-19 MED ORDER — HYDROMORPHONE HCL 1 MG/ML IJ SOLN
INTRAMUSCULAR | Status: DC | PRN
  Administered 2020-10-19: .25 mg via INTRAVENOUS

## 2020-10-19 MED ORDER — ACETAMINOPHEN 10 MG/ML IV SOLN
INTRAVENOUS | Status: AC
  Filled 2020-10-19: qty 100

## 2020-10-19 MED ORDER — METOCLOPRAMIDE HCL 10 MG PO TABS: 5.0000 mg | ORAL_TABLET | Freq: Three times a day (TID) | ORAL | Status: DC | PRN

## 2020-10-19 MED ORDER — SUGAMMADEX SODIUM 500 MG/5ML IV SOLN
INTRAVENOUS | Status: AC
  Filled 2020-10-19: qty 5

## 2020-10-19 MED ORDER — DEXTROSE 50 % IV SOLN
INTRAVENOUS | Status: AC
  Filled 2020-10-19: qty 50

## 2020-10-19 MED ORDER — DEXTROSE 50 % IV SOLN
25.0000 mL | Freq: Once | INTRAVENOUS | Status: AC
  Administered 2020-10-19: 15:00:00 25 mL via INTRAVENOUS

## 2020-10-19 MED ORDER — FENTANYL CITRATE (PF) 100 MCG/2ML IJ SOLN
INTRAMUSCULAR | Status: AC
  Filled 2020-10-19: qty 2

## 2020-10-19 MED ORDER — LIDOCAINE HCL (PF) 2 % IJ SOLN
INTRAMUSCULAR | Status: AC
  Filled 2020-10-19: qty 5

## 2020-10-19 MED ORDER — ONDANSETRON HCL 4 MG/2ML IJ SOLN: 4.0000 mg | Freq: Once | INTRAMUSCULAR | Status: DC | PRN

## 2020-10-19 MED ORDER — METOCLOPRAMIDE HCL 5 MG/ML IJ SOLN: 5.0000 mg | Freq: Three times a day (TID) | INTRAMUSCULAR | Status: DC | PRN

## 2020-10-19 MED ORDER — SUCCINYLCHOLINE CHLORIDE 200 MG/10ML IV SOSY
PREFILLED_SYRINGE | INTRAVENOUS | Status: AC
  Filled 2020-10-19: qty 10

## 2020-10-19 MED ORDER — FAMOTIDINE 20 MG PO TABS
ORAL_TABLET | ORAL | Status: AC
  Administered 2020-10-19: 13:00:00 20 mg via ORAL
  Filled 2020-10-19: qty 1

## 2020-10-19 MED ORDER — GLYCOPYRROLATE 0.2 MG/ML IJ SOLN
INTRAMUSCULAR | Status: AC
  Filled 2020-10-19: qty 2

## 2020-10-19 MED ORDER — CHLORHEXIDINE GLUCONATE 0.12 % MT SOLN
OROMUCOSAL | Status: AC
  Administered 2020-10-19: 13:00:00 15 mL via OROMUCOSAL
  Filled 2020-10-19: qty 15

## 2020-10-19 MED ORDER — SODIUM CHLORIDE 0.9 % IV SOLN: INTRAVENOUS | Status: DC

## 2020-10-19 MED ORDER — FAMOTIDINE 20 MG PO TABS: 20.0000 mg | ORAL_TABLET | Freq: Once | ORAL | Status: AC

## 2020-10-19 MED ORDER — DEXAMETHASONE SODIUM PHOSPHATE 10 MG/ML IJ SOLN
INTRAMUSCULAR | Status: AC
  Filled 2020-10-19: qty 2

## 2020-10-19 MED ORDER — ONDANSETRON HCL 4 MG PO TABS
4.0000 mg | ORAL_TABLET | Freq: Four times a day (QID) | ORAL | Status: DC | PRN
  Administered 2020-10-19: 17:00:00 4 mg via ORAL

## 2020-10-19 MED ORDER — KETAMINE HCL 10 MG/ML IJ SOLN
INTRAMUSCULAR | Status: DC | PRN
  Administered 2020-10-19: 5 mg via INTRAVENOUS
  Administered 2020-10-19: 20 mg via INTRAVENOUS
  Administered 2020-10-19: 10 mg via INTRAVENOUS

## 2020-10-19 MED ORDER — FENTANYL CITRATE (PF) 100 MCG/2ML IJ SOLN
25.0000 ug | INTRAMUSCULAR | Status: AC | PRN
  Administered 2020-10-19 (×6): 25 ug via INTRAVENOUS

## 2020-10-19 MED ORDER — DEXTROSE 50 % IV SOLN
INTRAVENOUS | Status: AC
  Administered 2020-10-19: 14:00:00 12.5 mL via INTRAVENOUS
  Filled 2020-10-19: qty 50

## 2020-10-19 SURGICAL SUPPLY — 39 items
APL PRP STRL LF DISP 70% ISPRP (MISCELLANEOUS) ×2
BNDG COHESIVE 4X5 TAN STRL (GAUZE/BANDAGES/DRESSINGS) ×3 IMPLANT
BNDG ELASTIC 4X5.8 VLCR STR LF (GAUZE/BANDAGES/DRESSINGS) ×3 IMPLANT
BNDG ESMARK 4X12 TAN STRL LF (GAUZE/BANDAGES/DRESSINGS) ×3 IMPLANT
CANISTER SUCT 1200ML W/VALVE (MISCELLANEOUS) ×3 IMPLANT
CHLORAPREP W/TINT 26 (MISCELLANEOUS) ×6 IMPLANT
COVER WAND RF STERILE (DRAPES) ×3 IMPLANT
CUFF TOURN SGL QUICK 18X4 (TOURNIQUET CUFF) ×3 IMPLANT
CUFF TOURN SGL QUICK 24 (TOURNIQUET CUFF)
CUFF TRNQT CYL 24X4X16.5-23 (TOURNIQUET CUFF) IMPLANT
DRAPE FLUOR MINI C-ARM 54X84 (DRAPES) ×3 IMPLANT
DRAPE INCISE IOBAN 66X45 STRL (DRAPES) ×3 IMPLANT
DRAPE U-SHAPE 47X51 STRL (DRAPES) ×3 IMPLANT
ELECT CAUTERY BLADE 6.4 (BLADE) ×3 IMPLANT
ELECT REM PT RETURN 9FT ADLT (ELECTROSURGICAL) ×3
ELECTRODE REM PT RTRN 9FT ADLT (ELECTROSURGICAL) ×1 IMPLANT
GAUZE SPONGE 4X4 12PLY STRL (GAUZE/BANDAGES/DRESSINGS) ×3 IMPLANT
GAUZE XEROFORM 1X8 LF (GAUZE/BANDAGES/DRESSINGS) ×6 IMPLANT
GLOVE BIO SURGEON STRL SZ8 (GLOVE) ×6 IMPLANT
GLOVE INDICATOR 8.0 STRL GRN (GLOVE) ×3 IMPLANT
GOWN STRL REUS W/ TWL LRG LVL3 (GOWN DISPOSABLE) ×2 IMPLANT
GOWN STRL REUS W/ TWL XL LVL3 (GOWN DISPOSABLE) ×1 IMPLANT
GOWN STRL REUS W/TWL LRG LVL3 (GOWN DISPOSABLE) ×6
GOWN STRL REUS W/TWL XL LVL3 (GOWN DISPOSABLE) ×3
KIT TURNOVER KIT A (KITS) ×3 IMPLANT
MANIFOLD NEPTUNE II (INSTRUMENTS) ×3 IMPLANT
NEEDLE FILTER BLUNT 18X 1/2SAF (NEEDLE) ×2
NEEDLE FILTER BLUNT 18X1 1/2 (NEEDLE) ×1 IMPLANT
NS IRRIG 1000ML POUR BTL (IV SOLUTION) ×3 IMPLANT
PACK EXTREMITY ARMC (MISCELLANEOUS) ×3 IMPLANT
PAD CAST CTTN 4X4 STRL (SOFTGOODS) ×1 IMPLANT
PADDING CAST COTTON 4X4 STRL (SOFTGOODS) ×3
SPLINT CAST 1 STEP 3X12 (MISCELLANEOUS) ×3 IMPLANT
STAPLER SKIN PROX 35W (STAPLE) ×3 IMPLANT
STOCKINETTE M/LG 89821 (MISCELLANEOUS) ×3 IMPLANT
SUT PROLENE 4 0 PS 2 18 (SUTURE) ×9 IMPLANT
SUT VIC AB 2-0 SH 27 (SUTURE) ×6
SUT VIC AB 2-0 SH 27XBRD (SUTURE) ×2 IMPLANT
SYR 10ML LL (SYRINGE) ×3 IMPLANT

## 2020-10-19 NOTE — H&P (Signed)
History of Present Illness:  Dawn Farrell is a 64 y.o. female who presents for evaluation and treatment of her recent left wrist injury. Apparently, the patient fell off a ladder on 08/27/2020 while in [place] and sustained an open left distal radius fracture. She was treated by a local orthopedic surgeon with a dorsal spanning plate on 16/10/958, then has been following up with Dedra Skeens, PA-C, for local wound care on a weekly basis. She has noted intermittent drainage from the wound which has been treated with Bactrim DS. On today's visit, the patient still notes moderate discomfort in her wrist, especially if she accidentally twists the wrist, which she rates at 4/10. She has been taking Tylenol and/or Norco (7.5/325) as necessary with temporary partial relief of her symptoms. The patient denies any fevers or chills, and notes that the drainage has stopped from all wounds. She still has great difficulty flexing or extending any digit, and still notes some paresthesias to the tips of all digits. She denies any reinjury to the wrist.  Current Outpatient Medications: . aspirin 81 MG chewable tablet Take 81 mg by mouth daily.  [name] atorvastatin (LIPITOR) 40 MG tablet Take 1 tablet (40 mg total) by mouth once daily 90 tablet 3  . enalapril (VASOTEC) 2.5 MG tablet Take 1 tablet (2.5 mg total) by mouth once daily 90 tablet 3  . gabapentin (NEURONTIN) 300 MG capsule Take 1 capsule (300 mg total) by mouth 3 (three) times daily 90 capsule 11  . HYDROcodone-acetaminophen (NORCO) 7.5-325 mg tablet Take 1 tablet by mouth every 6 (six) hours as needed for Pain for up to 40 doses 40 tablet 0  . ibuprofen (MOTRIN) 800 MG tablet  . JARDIANCE 25 mg tablet Take 1 tablet by mouth once daily with breakfast 30 tablet 5  . LANTUS SOLOSTAR U-100 INSULIN pen injector (concentration 100 units/mL) INJECT 60 UNITS SUBCUTANEOUSLY NIGHTLY 30 mL 5  . metFORMIN (GLUCOPHAGE) 500 MG tablet Take 1 tablet (500 mg total) by mouth once  daily - take with supper 90 tablet 1  . metoprolol succinate (TOPROL-XL) 25 MG XL tablet Take 1 tablet (25 mg total) by mouth once daily 90 tablet 3  . pen needle, diabetic (BD INSULIN PEN NEEDLE UF SHORT) 31 gauge x 5/16" needle Check blood sugar once daily. 100 each 11  . semaglutide (OZEMPIC) 0.25 mg or 0.5 mg(2 mg/1.5 mL) pen injector Inject 0.1875 mLs (0.25 mg total) subcutaneously once a week 1.5 mL 3   Allergies:  . Latex - Rash   Past Medical History:  . Abnormal mammogram 02/16/2014  Prior Dr. Michela Pitcher office ordered. Pt now elects PCP to order.  . Acute pyelonephritis 12/17/2019  . Bacteremia 12/17/2019  . Basal cell carcinoma of skin  . 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 COPD (chronic obstructive pulmonary disease) (CMS-HCC) 12/17/2019  . Diabetes mellitus, type 2 (CMS-HCC) 2013  . 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 Eczema, unspecified  . Essential (primary) hypertension 02/15/2019  . Fatty liver  . History of colonic polyps  . History of wheezing  Followed by Dr. Priscille Heidelberg (pulmonology)  . Hx of basal cell carcinoma  Dr. Gwen Pounds  . Hyperlipidemia  . Kidney stone-history 07/30/2013  . Multinodular goiter 02/15/2019  . Multiple thyroid nodules  FNA of two nodules 11/2014- benign  . Nephrolithiasis 2017  . Obesity  . Platelets decreased (CMS-HCC)  . Rheumatoid factor positive 12/30/2018  Pt declines referral to rheum  at this time. 12/2018  . 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 Supraventricular tachycardia (CMS-HCC) 05/03/2016  Dr Lady Gary did full work up and pt now takes beta blocker for control  . SVT (supraventricular tachycardia) (CMS-HCC)  . Tobacco abuse  . Type 2 diabetes mellitus with microalbuminuria, with long-term current use of insulin (CMS-HCC) 02/15/2019   Past Surgical History:  Procedure Laterality Date  . CESAREAN SECTION 1989  . CESAREAN SECTION 1985  . CHOLECYSTECTOMY 2013  . COLONOSCOPY  02/11/2012, 11/26/2008  Dr. Eber Hong @ [facility] Adenomatous Polyps, rpt 5 yrs per [facility]  . COLONOSCOPY 01/15/2018  Normal/PHX of colon polyps/Repeat in 5 yrs/TKT  . FRACTURE SURGERY Left  ( OPEN FX LT DISTAL RADIUS AND ULNA)  . HYSTERECTOMY  Partial, secondary to fibroids  . MOUTH SURGERY  . TUBAL LIGATION 1991  . UMBILICAL HERNIA REPAIR 2013   Family History:  [name] Myocardial Infarction (Heart attack) Mother  . High blood pressure (Hypertension) Mother  . Diabetes type II Mother  . Cancer Mother  . Osteoporosis (Thinning of bones) Mother  . Thyroid disease Mother  . Glaucoma Mother  . Anemia Mother  . Depression Mother  . Hyperlipidemia (Elevated cholesterol) Mother  . Osteoarthritis Mother  . Rheum arthritis Mother  . Stroke Mother  . High blood pressure (Hypertension) Father  . Myocardial Infarction (Heart attack) Father  . Diabetes type II Father  . Stroke Father  . Alcohol abuse Father  . COPD Father  . Heart disease Father  . Breast cancer Sister  . High blood pressure (Hypertension) Sister  . Diabetes Sister  . High blood pressure (Hypertension) Brother  . Diabetes Brother  . Kidney disease Brother  one kidney remains  . Heart disease Brother  bypass  . Seizures Daughter  . Anemia Daughter  . No Known Problems Maternal Grandmother  . Lung cancer Maternal Grandfather  . Diabetes Paternal Grandmother  . Parkinsonism Paternal Grandmother  . No Known Problems Paternal Grandfather  . Sleep apnea Sister  . Diabetes Sister  . High blood pressure (Hypertension) Sister  . Liver disease Sister  . Cervical cancer Sister  . Fibroids Sister  . Thyroid disease Sister  . Heart disease Sister  . Anxiety Sister  . High blood pressure (Hypertension) Sister  . No Known Problems Sister  . Diabetes Brother  COD from complications bg was 700  . High blood pressure (Hypertension) Brother  . Post-traumatic stress disorder Son   Social History:   Socioeconomic History:   [name] Marital status: Married  Spouse name: Not on file  . Number of children: 2  . Years of education: Not on file  . Highest education level: Associate degree: occupational, Scientist, product/process development, or vocational program  Occupational History  . Not on file  Tobacco Use  . Smoking status: Former Smoker  Packs/day: 1.00  Years: 40.00  Pack years: 40.00  Types: Cigarettes  Quit date: 01/04/2015  Years since quitting: 5.7  . Smokeless tobacco: Never Used  Vaping Use  . Vaping Use: Never used  Substance and Sexual Activity  . Alcohol use: Yes  Comment: rarely (once a month)  . Drug use: No  . Sexual activity: Not Currently  Birth control/protection: Surgical  Other Topics Concern  . Not on file  Social History Narrative  . Not on file   Social Determinants of Health:   Financial Resource Strain: Not on file  Food Insecurity: Not on file  Transportation Needs: Not on file   Review of Systems:  A comprehensive 14 point ROS was performed, reviewed, and the pertinent orthopaedic findings are documented in the HPI.  Physical Exam: Vitals:  10/10/20 0913  BP: 132/80  Weight: 69.1 kg (152 lb 6.4 oz)  Height: 156 cm (5' 1.42")  PainSc: 4  PainLoc: Wrist   General/Constitutional: The patient appears to be well-nourished, well-developed, and in no acute distress. Neuro/Psych: Normal mood and affect, oriented to person, place and time. Eyes: Non-icteric. Pupils are equal, round, and reactive to light, and exhibit synchronous movement. ENT: Unremarkable. Lymphatic: No palpable adenopathy. Respiratory: Lungs clear to auscultation, Normal chest excursion, No wheezes and Non-labored breathing Cardiovascular: Regular rate and rhythm. No murmurs. and No edema, swelling or tenderness, except as noted in detailed exam. Integumentary: No impressive skin lesions present, except as noted in detailed exam. Musculoskeletal: Unremarkable, except as noted in detailed exam.  Left wrist exam: Skin  inspection of the left wrist demonstrates her surgical incisions to be well-healed and without evidence for infection. The more ulnar laceration, the site of her open fracture, exhibits slight redness, but there again is no drainage from this area. She exhibits mild-moderate swelling around the wrist dorsally and volarly she has moderate pain with any attempted active or passive twisting of the wrist. She has no active or passive flexion of the wrist due to the presence of the internal plate. Finger motion also is extremely limited due to pain and apprehension. However, she does exhibit limited flexion and extension of all digits. She has intact sensation to light touch to all digits, although she does note some decreased sensation and "tingling" to the tips of the thumb, index, and long fingers. She has good capillary refill to all digits.  X-rays/MRI/Lab data:  AP, lateral, oblique views of the left wrist are obtained. These films demonstrate excellent interval healing of the severely comminuted intra-articular left distal radius fracture with overall satisfactory maintenance of fracture alignment. The hardware also appears to be in excellent position and without evidence of loosening. No new acute bony abnormalities are identified.  Assessment: 1. Type I or II open Colles' fracture of left radius.  2. Acute left carpal tunnel syndrome.  Plan: The treatment options were discussed with the patient. In addition, patient educational materials were provided regarding the diagnosis and treatment options. The patient is quite frustrated by her symptoms and function limitations. After review of the treating surgeons notes and discussion with an upper extremity trauma surgeon at [facility], the consensus is that the plate needs to be gone for 3 months. However, I am concerned that she is having some nerve compression issues. Therefore, we will proceed with an open left carpal tunnel release. The procedure was  discussed with the patient, as were the potential risks (including bleeding, infection, nerve and/or blood vessel injury, persistent or recurrent pain/paresthesias, weakness of grip, stiffness of the wrist, need for further surgery, blood clots, strokes, heart attacks and/or arhythmias, pneumonia, etc.) and benefits. The patient states her understanding and wishes to proceed. All of the patient's questions and concerns were answered. She can call any time with further concerns. She will follow up post-surgery, routine.   H&P reviewed and patient re-examined. No changes.

## 2020-10-19 NOTE — Transfer of Care (Signed)
Immediate Anesthesia Transfer of Care Note  Patient: Dawn Farrell  Procedure(s) Performed: OPEN CARPAL TUNNEL RELEASE OF LEFT WRIST (Left Wrist)  Patient Location: PACU  Anesthesia Type:General  Level of Consciousness: awake and patient cooperative  Airway & Oxygen Therapy: Patient Spontanous Breathing  Post-op Assessment: Report given to RN and Post -op Vital signs reviewed and stable  Post vital signs: Reviewed and stable  Last Vitals:  Vitals Value Taken Time  BP 148/92 10/19/20 1629  Temp 36.2 C 10/19/20 1629  Pulse 88 10/19/20 1634  Resp 11 10/19/20 1634  SpO2 99 % 10/19/20 1634  Vitals shown include unvalidated device data.  Last Pain:  Vitals:   10/19/20 1257  TempSrc: Oral  PainSc: 3          Complications: No complications documented.

## 2020-10-19 NOTE — Anesthesia Procedure Notes (Signed)
Procedure Name: LMA Insertion Performed by: Fletcher-Harrison, Caydon Feasel, CRNA Pre-anesthesia Checklist: Patient identified, Emergency Drugs available, Suction available and Patient being monitored Patient Re-evaluated:Patient Re-evaluated prior to induction Oxygen Delivery Method: Simple face mask Preoxygenation: Pre-oxygenation with 100% oxygen Induction Type: IV induction Ventilation: Mask ventilation without difficulty LMA: LMA inserted LMA Size: 4.0 Placement Confirmation: positive ETCO2 and CO2 detector Tube secured with: Tape Dental Injury: Teeth and Oropharynx as per pre-operative assessment        

## 2020-10-19 NOTE — Op Note (Signed)
10/19/2020  4:23 PM  Patient:   Dawn Farrell  Pre-Op Diagnosis:   Sub-acute left carpal tunnel syndrome status post left distal radius fracture.  Post-Op Diagnosis:   Same.  Procedure:   Open left carpal tunnel release.  Surgeon:   Maryagnes Amos, MD  Anesthesia:   General LMA  Findings:   As above.  Complications:   None  EBL:   0 cc  Fluids:   1000 cc crystalloid  TT:   26 minutes at 250 mmHg  Drains:   None  Closure:   4-0 Prolene interrupted sutures  Brief Clinical Note:   The patient is a 64 year old female who sustained a type I open severely comminuted intra-articular left Colles' fracture 8 weeks ago while in [place]. This was treated with an open reduction via a volar approach followed by the placement of a dorsal spanning plate. At her last visit, she was noted to have increased pain and paresthesias to the median nerve distribution of her left hand. The patient presents at this time for an open left carpal tunnel release.   Procedure:   The patient was brought into the operating room and lain in the supine position. After adequate general laryngeal mask anesthesia was achieved, the right hand and upper extremity were prepped with ChloraPrep solution before being draped sterilely. Preoperative antibiotics were administered. A timeout was performed to verify the appropriate surgical site before the limb was exsanguinated with an Esmarch and the tourniquet inflated to 250 mmHg.    An approximately 3 to 4 cm incision was made over the volar aspect of her palm and extended obliquely across the wrist flexor crease. The incision was carried down through the subcutaneous tissues with care taken to identify and protect any neurovascular structures. The distal forearm fascia was penetrated just proximal to the transverse carpal ligament. The soft tissues were released off the superficial and deep surfaces of the distal forearm fascia and this was released proximally for  several centimeters under direct visualization. The nerve was visualized directly before a Therapist, nutritional was passed beneath the transverse carpal ligament along the ulnar aspect of the carpal tunnel and used to release any adhesions as well as to remove any adherent synovial tissue. It also was used to protect the underlying nerve as the transverse carpal ligament was transected in line with the incision. Hemostasis was achieved using bipolar electrocautery.  The wound was irrigated thoroughly with sterile saline solution before being closed using 4-0 Prolene interrupted sutures. A total of 10 cc of 0.5% plain Sensorcaine was injected in and around the incision before a sterile bulky dressing was applied to the wound. The patient was placed into a volar fiberglass splint to maintain the wrist in neutral position before being awakened, extubated, and returned to the recovery room in satisfactory condition after tolerating the procedure well.

## 2020-10-19 NOTE — Discharge Instructions (Addendum)
Orthopedic discharge instructions: Keep splint/dressing dry and intact. Keep hand elevated above heart level. May shower after splint/dressing removed on postop day 4 (Monday). Cover sutures with Band-Aids after drying off. Apply ice to affected area frequently. Take ibuprofen 600-800 mg TID with meals for 7-10 days, then as necessary. Take ES Tylenol or pain medication as prescribed when needed.  Return for follow-up in 10-14 days or as scheduled.   AMBULATORY SURGERY  DISCHARGE INSTRUCTIONS  1) The drugs that you were given will stay in your system until tomorrow so for the next 24 hours you should not: A) Drive an automobile B) Make any legal decisions C) Drink any alcoholic beverage  2) You may resume regular meals tomorrow.  Today it is better to start with liquids and gradually work up to solid foods. You may eat anything you prefer, but it is better to start with liquids, then soup and crackers, and gradually work up to solid foods.  3) Please notify your doctor immediately if you have any unusual bleeding, trouble breathing, redness and pain at the surgery site, drainage, fever, or pain not relieved by medication.  Additional Instructions:   Please contact your physician with any problems or Same Day Surgery at [phone], Monday through Friday 6 am to 4 pm, or [place] at [facility] number at [phone].

## 2020-10-19 NOTE — Anesthesia Postprocedure Evaluation (Signed)
Anesthesia Post Note  Patient: Dawn Farrell  Procedure(s) Performed: OPEN CARPAL TUNNEL RELEASE OF LEFT WRIST (Left Wrist)  Patient location during evaluation: PACU Anesthesia Type: General Level of consciousness: awake and alert Pain management: pain level controlled Vital Signs Assessment: post-procedure vital signs reviewed and stable Respiratory status: spontaneous breathing and respiratory function stable Cardiovascular status: stable Anesthetic complications: no   No complications documented.   Last Vitals:  Vitals:   10/19/20 1708 10/19/20 1714  BP:  (!) 143/78  Pulse: 77 79  Resp: (!) 8 11  Temp:  (!) 36.2 C  SpO2: 95% 94%    Last Pain:  Vitals:   10/19/20 1714  TempSrc:   PainSc: 4                  [name]

## 2020-10-19 NOTE — Anesthesia Preprocedure Evaluation (Signed)
Anesthesia Evaluation  Patient identified by MRN, date of birth, ID band Patient awake    Reviewed: Allergy & Precautions, H&P , NPO status , Patient's Chart, lab work & pertinent test results, reviewed documented beta blocker date and time   Airway Mallampati: II  TM Distance: >3 FB Neck ROM: full    Dental  (+) Teeth Intact   Pulmonary neg shortness of breath, COPD, Current Smoker and Patient abstained from smoking.,    Pulmonary exam normal        Cardiovascular Exercise Tolerance: Good hypertension, On Medications negative cardio ROS  Supra Ventricular Tachycardia  Rate:Normal     Neuro/Psych negative neurological ROS  negative psych ROS   GI/Hepatic negative GI ROS, Neg liver ROS,   Endo/Other  negative endocrine ROSdiabetes  Renal/GU Renal disease  negative genitourinary   Musculoskeletal   Abdominal   Peds  Hematology negative hematology ROS (+)   Anesthesia Other Findings   Reproductive/Obstetrics negative OB ROS                             Anesthesia Physical Anesthesia Plan  ASA: III  Anesthesia Plan: General LMA   Post-op Pain Management:    Induction:   PONV Risk Score and Plan: 3  Airway Management Planned:   Additional Equipment:   Intra-op Plan:   Post-operative Plan:   Informed Consent: I have reviewed the patients History and Physical, chart, labs and discussed the procedure including the risks, benefits and alternatives for the proposed anesthesia with the patient or authorized representative who has indicated his/her understanding and acceptance.       Plan Discussed with: CRNA  Anesthesia Plan Comments:         Anesthesia Quick Evaluation

## 2020-10-20 ENCOUNTER — Encounter: Payer: Self-pay | Admitting: Surgery

## 2020-11-17 ENCOUNTER — Other Ambulatory Visit: Payer: Self-pay | Admitting: Surgery

## 2020-11-21 ENCOUNTER — Encounter
Admission: RE | Admit: 2020-11-21 | Discharge: 2020-11-21 | Disposition: A | Discharge status: Home / Self Care | Payer: PRIVATE HEALTH INSURANCE | Source: Ambulatory Visit | Attending: Surgery | Admitting: Surgery

## 2020-11-21 ENCOUNTER — Other Ambulatory Visit: Payer: Self-pay

## 2020-11-21 HISTORY — DX: Supraventricular tachycardia, unspecified: I47.10

## 2020-11-21 HISTORY — DX: Supraventricular tachycardia: I47.1

## 2020-11-21 HISTORY — DX: Other specified postprocedural states: Z98.890

## 2020-11-21 HISTORY — DX: Personal history of Methicillin resistant Staphylococcus aureus infection: Z86.14

## 2020-11-21 HISTORY — DX: Nausea with vomiting, unspecified: R11.2

## 2020-11-21 NOTE — Patient Instructions (Signed)
Your procedure is scheduled on: 11/24/20 Report to [place]. To find out your arrival time please call [phone] between 1PM - 3PM on 11/23/20.  Remember: Instructions that are not followed completely may result in serious medical risk, up to and including death, or upon the discretion of your surgeon and anesthesiologist your surgery may need to be rescheduled.     _X__ 1. Do not eat food after midnight the night before your procedure.                 No gum chewing or hard candies. You may drink clear liquids up to 2 hours                 before you are scheduled to arrive for your surgery- DO not drink clear                 liquids within 2 hours of the start of your surgery.                 Clear Liquids include:  water, apple juice without pulp, clear carbohydrate                 drink such as Clearfast or Gatorade, Black Coffee or Tea (Do not add                 anything to coffee or tea). Diabetics water only  __X__2.  On the morning of surgery brush your teeth with toothpaste and water, you                 may rinse your mouth with mouthwash if you wish.  Do not swallow any              toothpaste of mouthwash.     _X__ 3.  No Alcohol for 24 hours before or after surgery.   _X__ 4.  Do Not Smoke or use e-cigarettes For 24 Hours Prior to Your Surgery.                 Do not use any chewable tobacco products for at least 6 hours prior to                 surgery.  ____  5.  Bring all medications with you on the day of surgery if instructed.   __X__  6.  Notify your doctor if there is any change in your medical condition      (cold, fever, infections).     Do not wear jewelry, make-up, hairpins, clips or nail polish. Do not wear lotions, powders, or perfumes.  Do not shave 48 hours prior to surgery. Men may shave face and neck. Do not bring valuables to the hospital.    [facility] is not responsible for any belongings or  valuables.  Contacts, dentures/partials or body piercings may not be worn into surgery. Bring a case for your contacts, glasses or hearing aids, a denture cup will be supplied. Leave your suitcase in the car. After surgery it may be brought to your room. For patients admitted to the hospital, discharge time is determined by your treatment team.   Patients discharged the day of surgery will not be allowed to drive home.   Please read over the following fact sheets that you were given:   MRSA Information  __X__ Take these medicines the morning of surgery with A SIP OF WATER:  1. gabapentin (NEURONTIN) 300 MG capsule  2. HYDROcodone-acetaminophen (NORCO) 7.5-325 MG tablet MAY TAKE IF NEEDED  3.   4.  5.  6.  ____ Fleet Enema (as directed)   __X__ Use CHG Soap/SAGE wipes as directed  ____ Use inhalers on the day of surgery  __X__ Stop metformin/Janumet/Farxiga 2 days prior to surgery    __X__ Take 1/2 of usual insulin dose the night before surgery. No insulin the morning          of surgery.   ____ Stop Blood Thinners Coumadin/Plavix/Xarelto/Pleta/Pradaxa/Eliquis/Effient/Aspirin  on   Or contact your Surgeon, Cardiologist or Medical Doctor regarding  ability to stop your blood thinners  __X__ Stop Anti-inflammatories 7 days before surgery such as Advil, Ibuprofen, Motrin,  BC or Goodies Powder, Naprosyn, Naproxen, Aleve, Aspirin    __X__ Stop all herbal supplements, fish oil or vitamin E until after surgery.    ____ Bring C-Pap to the hospital.

## 2020-11-22 ENCOUNTER — Other Ambulatory Visit
Admission: RE | Admit: 2020-11-22 | Discharge: 2020-11-22 | Disposition: A | Discharge status: Home / Self Care | Payer: PRIVATE HEALTH INSURANCE | Source: Ambulatory Visit | Attending: Surgery | Admitting: Surgery

## 2020-11-22 DIAGNOSIS — Z20822 Contact with and (suspected) exposure to covid-19: Secondary | ICD-10-CM | POA: Insufficient documentation

## 2020-11-22 DIAGNOSIS — Z01812 Encounter for preprocedural laboratory examination: Secondary | ICD-10-CM | POA: Insufficient documentation

## 2020-11-22 LAB — SARS CORONAVIRUS 2 (TAT 6-24 HRS): SARS Coronavirus 2: NEGATIVE

## 2020-11-23 MED ORDER — SODIUM CHLORIDE 0.9 % IV SOLN: INTRAVENOUS | Status: DC

## 2020-11-23 MED ORDER — FAMOTIDINE 20 MG PO TABS
20.0000 mg | ORAL_TABLET | Freq: Once | ORAL | Status: AC
  Administered 2020-11-24: 20 mg via ORAL

## 2020-11-23 MED ORDER — CHLORHEXIDINE GLUCONATE 0.12 % MT SOLN
15.0000 mL | Freq: Once | OROMUCOSAL | Status: AC
  Administered 2020-11-24: 15 mL via OROMUCOSAL

## 2020-11-23 MED ORDER — CEFAZOLIN SODIUM-DEXTROSE 2-4 GM/100ML-% IV SOLN
2.0000 g | INTRAVENOUS | Status: AC
  Administered 2020-11-24 (×2): 2 g via INTRAVENOUS

## 2020-11-23 MED ORDER — ORAL CARE MOUTH RINSE: 15.0000 mL | Freq: Once | OROMUCOSAL | Status: AC

## 2020-11-24 ENCOUNTER — Encounter
Admission: RE | Disposition: A | Discharge status: Home / Self Care | Payer: Self-pay | Source: Home / Self Care | Attending: Surgery

## 2020-11-24 ENCOUNTER — Ambulatory Visit: Payer: PRIVATE HEALTH INSURANCE

## 2020-11-24 ENCOUNTER — Other Ambulatory Visit: Payer: Self-pay

## 2020-11-24 ENCOUNTER — Ambulatory Visit: Payer: PRIVATE HEALTH INSURANCE | Admitting: Anesthesiology

## 2020-11-24 ENCOUNTER — Ambulatory Visit
Admission: RE | Admit: 2020-11-24 | Discharge: 2020-11-24 | Disposition: A | Discharge status: Home / Self Care | Payer: PRIVATE HEALTH INSURANCE | Attending: Surgery | Admitting: Surgery

## 2020-11-24 ENCOUNTER — Encounter: Payer: Self-pay | Admitting: Surgery

## 2020-11-24 DIAGNOSIS — Z833 Family history of diabetes mellitus: Secondary | ICD-10-CM | POA: Insufficient documentation

## 2020-11-24 DIAGNOSIS — Z91040 Latex allergy status: Secondary | ICD-10-CM | POA: Diagnosis not present

## 2020-11-24 DIAGNOSIS — Z803 Family history of malignant neoplasm of breast: Secondary | ICD-10-CM | POA: Insufficient documentation

## 2020-11-24 DIAGNOSIS — Z472 Encounter for removal of internal fixation device: Secondary | ICD-10-CM | POA: Diagnosis not present

## 2020-11-24 DIAGNOSIS — Z809 Family history of malignant neoplasm, unspecified: Secondary | ICD-10-CM | POA: Diagnosis not present

## 2020-11-24 DIAGNOSIS — Z8249 Family history of ischemic heart disease and other diseases of the circulatory system: Secondary | ICD-10-CM | POA: Diagnosis not present

## 2020-11-24 DIAGNOSIS — Z8349 Family history of other endocrine, nutritional and metabolic diseases: Secondary | ICD-10-CM | POA: Insufficient documentation

## 2020-11-24 DIAGNOSIS — Z791 Long term (current) use of non-steroidal anti-inflammatories (NSAID): Secondary | ICD-10-CM | POA: Diagnosis not present

## 2020-11-24 DIAGNOSIS — Z801 Family history of malignant neoplasm of trachea, bronchus and lung: Secondary | ICD-10-CM | POA: Insufficient documentation

## 2020-11-24 DIAGNOSIS — E1142 Type 2 diabetes mellitus with diabetic polyneuropathy: Secondary | ICD-10-CM | POA: Insufficient documentation

## 2020-11-24 DIAGNOSIS — I471 Supraventricular tachycardia: Secondary | ICD-10-CM | POA: Insufficient documentation

## 2020-11-24 DIAGNOSIS — Z8262 Family history of osteoporosis: Secondary | ICD-10-CM | POA: Insufficient documentation

## 2020-11-24 DIAGNOSIS — Z800 Family history of malignant neoplasm of digestive organs: Secondary | ICD-10-CM | POA: Diagnosis not present

## 2020-11-24 DIAGNOSIS — Z820 Family history of epilepsy and other diseases of the nervous system: Secondary | ICD-10-CM | POA: Diagnosis not present

## 2020-11-24 DIAGNOSIS — Z87891 Personal history of nicotine dependence: Secondary | ICD-10-CM | POA: Insufficient documentation

## 2020-11-24 DIAGNOSIS — Z823 Family history of stroke: Secondary | ICD-10-CM | POA: Diagnosis not present

## 2020-11-24 DIAGNOSIS — Z7982 Long term (current) use of aspirin: Secondary | ICD-10-CM | POA: Insufficient documentation

## 2020-11-24 DIAGNOSIS — Z794 Long term (current) use of insulin: Secondary | ICD-10-CM | POA: Insufficient documentation

## 2020-11-24 DIAGNOSIS — Z8049 Family history of malignant neoplasm of other genital organs: Secondary | ICD-10-CM | POA: Insufficient documentation

## 2020-11-24 DIAGNOSIS — Z8261 Family history of arthritis: Secondary | ICD-10-CM | POA: Diagnosis not present

## 2020-11-24 DIAGNOSIS — Z79899 Other long term (current) drug therapy: Secondary | ICD-10-CM | POA: Diagnosis not present

## 2020-11-24 HISTORY — PX: HARDWARE REMOVAL: SHX979

## 2020-11-24 LAB — GLUCOSE, CAPILLARY
Glucose-Capillary: 128 mg/dL — ABNORMAL HIGH (ref 70–99)
Glucose-Capillary: 128 mg/dL — ABNORMAL HIGH (ref 70–99)

## 2020-11-24 SURGERY — REMOVAL, HARDWARE
Anesthesia: General | Site: Wrist | Laterality: Left

## 2020-11-24 MED ORDER — ACETAMINOPHEN 10 MG/ML IV SOLN
INTRAVENOUS | Status: DC | PRN
  Administered 2020-11-24: 1000 mg via INTRAVENOUS

## 2020-11-24 MED ORDER — HYDROMORPHONE HCL 1 MG/ML IJ SOLN
INTRAMUSCULAR | Status: AC
  Administered 2020-11-24: 0.5 mg via INTRAVENOUS
  Filled 2020-11-24: qty 1

## 2020-11-24 MED ORDER — CEFAZOLIN SODIUM-DEXTROSE 2-4 GM/100ML-% IV SOLN
INTRAVENOUS | Status: AC
  Filled 2020-11-24: qty 100

## 2020-11-24 MED ORDER — CHLORHEXIDINE GLUCONATE 0.12 % MT SOLN
OROMUCOSAL | Status: AC
  Filled 2020-11-24: qty 15

## 2020-11-24 MED ORDER — FAMOTIDINE 20 MG PO TABS
ORAL_TABLET | ORAL | Status: AC
  Filled 2020-11-24: qty 1

## 2020-11-24 MED ORDER — FENTANYL CITRATE (PF) 100 MCG/2ML IJ SOLN
INTRAMUSCULAR | Status: DC | PRN
  Administered 2020-11-24: 50 ug via INTRAVENOUS

## 2020-11-24 MED ORDER — LACTATED RINGERS IV SOLN: INTRAVENOUS | Status: DC | PRN

## 2020-11-24 MED ORDER — MIDAZOLAM HCL 2 MG/2ML IJ SOLN
INTRAMUSCULAR | Status: DC | PRN
  Administered 2020-11-24: 2 mg via INTRAVENOUS

## 2020-11-24 MED ORDER — HYDROCODONE-ACETAMINOPHEN 7.5-325 MG PO TABS
ORAL_TABLET | ORAL | Status: AC
  Filled 2020-11-24: qty 1

## 2020-11-24 MED ORDER — ONDANSETRON HCL 4 MG/2ML IJ SOLN
INTRAMUSCULAR | Status: AC
  Filled 2020-11-24: qty 2

## 2020-11-24 MED ORDER — BUPIVACAINE HCL (PF) 0.5 % IJ SOLN
INTRAMUSCULAR | Status: DC | PRN
  Administered 2020-11-24: 15 mL

## 2020-11-24 MED ORDER — OXYCODONE HCL 5 MG PO TABS: 5.0000 mg | ORAL_TABLET | Freq: Once | ORAL | Status: DC | PRN

## 2020-11-24 MED ORDER — ONDANSETRON HCL 4 MG/2ML IJ SOLN
4.0000 mg | Freq: Once | INTRAMUSCULAR | Status: AC
  Administered 2020-11-24: 4 mg via INTRAVENOUS

## 2020-11-24 MED ORDER — LIDOCAINE HCL (CARDIAC) PF 100 MG/5ML IV SOSY
PREFILLED_SYRINGE | INTRAVENOUS | Status: DC | PRN
  Administered 2020-11-24: 100 mg via INTRAVENOUS

## 2020-11-24 MED ORDER — PROMETHAZINE HCL 25 MG/ML IJ SOLN
6.2500 mg | INTRAMUSCULAR | Status: DC | PRN
Start: 2020-11-24 — End: 2020-11-24

## 2020-11-24 MED ORDER — BUPIVACAINE HCL (PF) 0.5 % IJ SOLN
INTRAMUSCULAR | Status: AC
  Filled 2020-11-24: qty 30

## 2020-11-24 MED ORDER — MEPERIDINE HCL 50 MG/ML IJ SOLN: 6.2500 mg | INTRAMUSCULAR | Status: DC | PRN

## 2020-11-24 MED ORDER — HYDROCODONE-ACETAMINOPHEN 7.5-325 MG PO TABS
1.0000 | ORAL_TABLET | Freq: Once | ORAL | Status: AC
  Administered 2020-11-24: 1 via ORAL

## 2020-11-24 MED ORDER — DROPERIDOL 2.5 MG/ML IJ SOLN
0.6250 mg | Freq: Once | INTRAMUSCULAR | Status: DC | PRN
  Filled 2020-11-24: qty 2

## 2020-11-24 MED ORDER — MIDAZOLAM HCL 2 MG/2ML IJ SOLN
INTRAMUSCULAR | Status: AC
  Filled 2020-11-24: qty 2

## 2020-11-24 MED ORDER — HYDROCODONE-ACETAMINOPHEN 7.5-325 MG PO TABS: 1.0000 | ORAL_TABLET | Freq: Four times a day (QID) | ORAL | 0 refills | Status: DC | PRN

## 2020-11-24 MED ORDER — PROPOFOL 10 MG/ML IV BOLUS
INTRAVENOUS | Status: DC | PRN
Start: 2020-11-24 — End: 2020-11-24
  Administered 2020-11-24: 200 mg via INTRAVENOUS

## 2020-11-24 MED ORDER — LORAZEPAM 2 MG/ML IJ SOLN: 1.0000 mg | Freq: Once | INTRAMUSCULAR | Status: DC | PRN

## 2020-11-24 MED ORDER — FENTANYL CITRATE (PF) 100 MCG/2ML IJ SOLN
INTRAMUSCULAR | Status: AC
  Filled 2020-11-24: qty 2

## 2020-11-24 MED ORDER — HYDROMORPHONE HCL 1 MG/ML IJ SOLN
0.2500 mg | INTRAMUSCULAR | Status: DC | PRN
Start: 2020-11-24 — End: 2020-11-24
  Administered 2020-11-24 (×2): 0.5 mg via INTRAVENOUS

## 2020-11-24 MED ORDER — PROPOFOL 10 MG/ML IV BOLUS
INTRAVENOUS | Status: AC
  Filled 2020-11-24: qty 20

## 2020-11-24 MED ORDER — DEXAMETHASONE SODIUM PHOSPHATE 10 MG/ML IJ SOLN
INTRAMUSCULAR | Status: DC | PRN
  Administered 2020-11-24: 10 mg via INTRAVENOUS

## 2020-11-24 MED ORDER — OXYCODONE HCL 5 MG/5ML PO SOLN: 5.0000 mg | Freq: Once | ORAL | Status: DC | PRN

## 2020-11-24 MED ORDER — ACETAMINOPHEN 10 MG/ML IV SOLN
INTRAVENOUS | Status: AC
  Filled 2020-11-24: qty 100

## 2020-11-24 SURGICAL SUPPLY — 39 items
APL PRP STRL LF DISP 70% ISPRP (MISCELLANEOUS) ×2
BNDG COHESIVE 4X5 TAN STRL (GAUZE/BANDAGES/DRESSINGS) ×2 IMPLANT
BNDG ELASTIC 4X5.8 VLCR STR LF (GAUZE/BANDAGES/DRESSINGS) ×2 IMPLANT
BNDG ESMARK 4X12 TAN STRL LF (GAUZE/BANDAGES/DRESSINGS) ×2 IMPLANT
CANISTER SUCT 1200ML W/VALVE (MISCELLANEOUS) ×2 IMPLANT
CHLORAPREP W/TINT 26 (MISCELLANEOUS) ×4 IMPLANT
COVER WAND RF STERILE (DRAPES) ×2 IMPLANT
CUFF TOURN SGL QUICK 18X4 (TOURNIQUET CUFF) ×2 IMPLANT
CUFF TOURN SGL QUICK 24 (TOURNIQUET CUFF)
CUFF TRNQT CYL 24X4X16.5-23 (TOURNIQUET CUFF) IMPLANT
DRAPE FLUOR MINI C-ARM 54X84 (DRAPES) ×2 IMPLANT
DRAPE INCISE IOBAN 66X45 STRL (DRAPES) ×2 IMPLANT
DRAPE U-SHAPE 47X51 STRL (DRAPES) ×2 IMPLANT
ELECT CAUTERY BLADE 6.4 (BLADE) ×2 IMPLANT
ELECT REM PT RETURN 9FT ADLT (ELECTROSURGICAL) ×2
ELECTRODE REM PT RTRN 9FT ADLT (ELECTROSURGICAL) ×1 IMPLANT
GAUZE SPONGE 4X4 12PLY STRL (GAUZE/BANDAGES/DRESSINGS) ×2 IMPLANT
GAUZE XEROFORM 1X8 LF (GAUZE/BANDAGES/DRESSINGS) ×2 IMPLANT
GLOVE BIO SURGEON STRL SZ8 (GLOVE) ×4 IMPLANT
GLOVE INDICATOR 8.0 STRL GRN (GLOVE) ×2 IMPLANT
GOWN STRL REUS W/ TWL LRG LVL3 (GOWN DISPOSABLE) ×2 IMPLANT
GOWN STRL REUS W/ TWL XL LVL3 (GOWN DISPOSABLE) ×1 IMPLANT
GOWN STRL REUS W/TWL LRG LVL3 (GOWN DISPOSABLE) ×4
GOWN STRL REUS W/TWL XL LVL3 (GOWN DISPOSABLE) ×2
KIT TURNOVER KIT A (KITS) ×2 IMPLANT
MANIFOLD NEPTUNE II (INSTRUMENTS) ×2 IMPLANT
NEEDLE FILTER BLUNT 18X 1/2SAF (NEEDLE) ×1
NEEDLE FILTER BLUNT 18X1 1/2 (NEEDLE) ×1 IMPLANT
NS IRRIG 1000ML POUR BTL (IV SOLUTION) ×2 IMPLANT
PACK EXTREMITY ARMC (MISCELLANEOUS) ×2 IMPLANT
PADDING CAST 3IN STRL (MISCELLANEOUS) ×2
PADDING CAST BLEND 3X4 STRL (MISCELLANEOUS) ×2 IMPLANT
SPLINT CAST 1 STEP 3X12 (MISCELLANEOUS) ×2 IMPLANT
STAPLER SKIN PROX 35W (STAPLE) ×2 IMPLANT
STOCKINETTE M/LG 89821 (MISCELLANEOUS) ×2 IMPLANT
SUT PROLENE 4 0 PS 2 18 (SUTURE) ×4 IMPLANT
SUT VIC AB 2-0 SH 27 (SUTURE) ×4
SUT VIC AB 2-0 SH 27XBRD (SUTURE) ×2 IMPLANT
SYR 10ML LL (SYRINGE) ×2 IMPLANT

## 2020-11-24 NOTE — Transfer of Care (Signed)
Immediate Anesthesia Transfer of Care Note  Patient: Dawn Farrell  Procedure(s) Performed: HARDWARE REMOVAL (Left Wrist)  Patient Location: PACU  Anesthesia Type:General  Level of Consciousness: awake and alert   Airway & Oxygen Therapy: Patient Spontanous Breathing and Patient connected to face mask oxygen  Post-op Assessment: Report given to RN and Post -op Vital signs reviewed and stable  Post vital signs: Reviewed and stable  Last Vitals:  Vitals Value Taken Time  BP 154/92 11/24/20 1050  Temp 35.8 C 11/24/20 1045  Pulse 79 11/24/20 1050  Resp 11 11/24/20 1050  SpO2 100 % 11/24/20 1050  Vitals shown include unvalidated device data.  Last Pain:  Vitals:   11/24/20 0759  TempSrc: Oral  PainSc: 0-No pain         Complications: No complications documented.

## 2020-11-24 NOTE — Discharge Instructions (Addendum)
° ° °  Pick up prescription at [facility] in [place].     AMBULATORY SURGERY  DISCHARGE INSTRUCTIONS   1) The drugs that you were given will stay in your system until tomorrow so for the next 24 hours you should not:  A) Drive an automobile B) Make any legal decisions C) Drink any alcoholic beverage   2) You may resume regular meals tomorrow.  Today it is better to start with liquids and gradually work up to solid foods.  You may eat anything you prefer, but it is better to start with liquids, then soup and crackers, and gradually work up to solid foods.   3) Please notify your doctor immediately if you have any unusual bleeding, trouble breathing, redness and pain at the surgery site, drainage, fever, or pain not relieved by medication.    4) Additional Instructions:        Please contact your physician with any problems or Same Day Surgery at [phone], Monday through Friday 6 am to 4 pm, or [place] [facility] number at [phone].Orthopedic discharge instructions: Keep dressing dry and intact. Keep hand elevated above heart level. May shower after dressing removed on postop day 4 (Monday). Cover sutures with Band-Aids after drying off, then reapply splint as needed for comfort. Apply ice to affected area frequently. Take ibuprofen 600-800 mg TID with meals for 7-10 days, then as necessary. Take ES Tylenol or pain medication as prescribed when needed.  Return for follow-up in 10-14 days or as scheduled.

## 2020-11-24 NOTE — OR Nursing (Signed)
A total of 8 screws and 1 plate were removed from forearm by Dr. Roland Rack.

## 2020-11-24 NOTE — Anesthesia Preprocedure Evaluation (Addendum)
Anesthesia Evaluation  Patient identified by MRN, date of birth, ID band Patient awake    Reviewed: Allergy & Precautions, H&P , NPO status , Patient's Chart, lab work & pertinent test results  History of Anesthesia Complications (+) PONV and history of anesthetic complications  Airway Mallampati: II       Dental  (+) Edentulous Upper   Pulmonary COPD, Current Smoker and Patient abstained from smoking.,    Pulmonary exam normal breath sounds clear to auscultation       Cardiovascular hypertension, negative cardio ROS Normal cardiovascular exam Rhythm:Regular Rate:Normal     Neuro/Psych negative neurological ROS  negative psych ROS   GI/Hepatic negative GI ROS, Neg liver ROS,   Endo/Other  negative endocrine ROSdiabetes  Renal/GU Renal disease  negative genitourinary   Musculoskeletal negative musculoskeletal ROS (+)   Abdominal   Peds negative pediatric ROS (+)  Hematology negative hematology ROS (+)   Anesthesia Other Findings Past Medical History: No date: Cancer ([place])     Comment:  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 No date: COPD (chronic obstructive pulmonary disease) (HCC) No date: Diabetes mellitus without complication (HCC) No date: History of kidney stones No date: HTN (hypertension) No date: Hx MRSA infection No date: PONV (postoperative nausea and vomiting)     Comment:  ponv after recent carpal Aleina Burgio No date: Renal calculi No date: SVT (supraventricular tachycardia) (HCC)   Reproductive/Obstetrics negative OB ROS                            Anesthesia Physical Anesthesia Plan  ASA: III  Anesthesia Plan: General   Post-op Pain Management:    Induction: Intravenous  PONV Risk Score and Plan: 3 and Ondansetron and Dexamethasone  Airway Management Planned: LMA  Additional Equipment:   Intra-op Plan:   Post-operative Plan: Extubation in OR  Informed Consent: I have  reviewed the patients History and Physical, chart, labs and discussed the procedure including the risks, benefits and alternatives for the proposed anesthesia with the patient or authorized representative who has indicated his/her understanding and acceptance.     Dental advisory given  Plan Discussed with: CRNA, Anesthesiologist and Surgeon  Anesthesia Plan Comments:         Anesthesia Quick Evaluation

## 2020-11-24 NOTE — Anesthesia Postprocedure Evaluation (Signed)
Anesthesia Post Note  Patient: Dawn Farrell  Procedure(s) Performed: HARDWARE REMOVAL (Left Wrist)  Patient location during evaluation: PACU Anesthesia Type: General Level of consciousness: awake Pain management: pain level controlled Vital Signs Assessment: post-procedure vital signs reviewed and stable Respiratory status: spontaneous breathing Cardiovascular status: blood pressure returned to baseline and stable Postop Assessment: no apparent nausea or vomiting Anesthetic complications: no   No complications documented.   Last Vitals:  Vitals:   11/24/20 1257 11/24/20 1356  BP: 129/67 129/75  Pulse: 82 76  Resp: 18 16  Temp: (!) 36 C   SpO2: 97% 99%    Last Pain:  Vitals:   11/24/20 1356  TempSrc:   PainSc: 3                  Neva Seat

## 2020-11-24 NOTE — Op Note (Signed)
11/24/2020  10:47 AM  Patient:   Dawn Farrell  Pre-Op Diagnosis:   Retained hardware status post spanning dorsal plate fixation of grade 1 open comminuted intra-articular distal radius fracture, left wrist.  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  Procedure:   Hardware removal left dorsal wrist.  Surgeon:   Pascal Lux, MD  Assistant:   None  Anesthesia:   General LMA  Findings:   As above.  Complications:   None  Fluids:   600 cc crystalloid  EBL:   2 cc  UOP:   None  TT:   47 minutes at 250 mmHg  Drains:   None  Closure:   3-0 Vicryl subcuticular sutures  Brief Clinical Note:   The patient is a 65 year old female who is now 3 months status post an open reduction and internal fixation of a grade 1 open comminuted intra-articular left distal radius fracture utilizing a wrist spanning dorsal plate.  The patient presents at this time for plate removal per standard protocol as she is now 3 months out from her initial surgery.  Procedure:   The patient was brought into the operating room and lain in the supine position.  After adequate general laryngeal mask anesthesia was obtained, the patient's left hand and upper extremity were prepped with ChloraPrep solution before being draped sterilely.  Preoperative antibiotics were administered.  A timeout was performed to verify the appropriate surgical site before each of the fingers were manipulated to try to optimize finger range of motion.  Each MCP joint was able to be flexed to 80 degrees and extended to 0 degrees whereas each PIP joint could be flexed to 90 degrees and extended to 0 degrees.  Once this manipulation was completed, the limb was exsanguinated with an Esmarch and the tourniquet inflated to 300 mmHg.  Utilizing the previous incisions over the mid-dorsal radial forearm and dorsal aspect of the index metacarpal respectively, each incision was reopened and carried down  through the subcutaneous tissues with care taken to identify and protect any important neurovascular or tendinous structures.  Distally, the plate was identified.  Each of the four screws were exposed and removed using the appropriate screwdriver.  Proximally, the plate also was identified just dorsal to the brachial radialis tendon.  Again, each of the 4 screws were identified and removed using the appropriate screwdriver.  The plate was loosened and removed through the distal incision.    The wrist was assessed using FluoroScan imaging in AP and lateral projections.  It appeared that at least one fragment dorso-ulnarly moved with wrist motion, suggesting that it was not healed.  Several other residual fracture lines were still visible, although no obvious motion was noted through these lines.   The wounds were copiously irrigated with sterile saline solution before being closed using 2-0 Vicryl interrupted sutures for the subcutaneous tissues.  The skin was closed using 3-0 Vicryl subcuticular sutures before benzoin and Steri-Strips were applied to the skin.  A total of 15 cc of 0.5% plain Sensorcaine was injected in and around the incision sites to help with postoperative analgesia before sterile bulky dressing was applied to the wrist.  A volar fiberglass splint was applied to the wrist and forearm, maintaining the wrist in neutral position.  The patient was then awakened, extubated, and returned to the recovery room in satisfactory condition after tolerating the procedure well.

## 2020-11-24 NOTE — H&P (Signed)
History of Present Illness: The patient is a 65 year old female presented for postop visit for her left wrist. She is status post left open carpal tunnel release that was done by Dr. Roland Rack on October 19, 2020. The patient is status post left open distal radius and ulnar fracture with spanning plate that was done in [place] in November. She was scheduled for hardware removal, but was transitioned to carpal tunnel release only on December 29. The patient is doing well with her dressing. She is still wearing her splint at all times. She has been out of work since November. She is beginning to have feeling come back in her fingers. The patient is a diabetic.  Medications: . acetaminophen (TYLENOL) 500 MG tablet Take 500 mg by mouth as needed for Pain  . aspirin 81 MG chewable tablet Take 81 mg by mouth daily.  [name] atorvastatin (LIPITOR) 40 MG tablet Take 1 tablet (40 mg total) by mouth once daily 90 tablet 3  . enalapril (VASOTEC) 2.5 MG tablet Take 1 tablet (2.5 mg total) by mouth once daily 90 tablet 3  . gabapentin (NEURONTIN) 300 MG capsule Take 1 capsule (300 mg total) by mouth 3 (three) times daily 90 capsule 11  . HYDROcodone-acetaminophen (NORCO) 7.5-325 mg tablet Take 1 tablet by mouth every 6 (six) hours as needed for Pain for up to 40 doses 40 tablet 0  . ibuprofen (MOTRIN) 800 MG tablet  . JARDIANCE 25 mg tablet Take 1 tablet by mouth once daily with breakfast 30 tablet 5  . LANTUS SOLOSTAR U-100 INSULIN pen injector (concentration 100 units/mL) INJECT 60 UNITS SUBCUTANEOUSLY NIGHTLY 30 mL 5  . metFORMIN (GLUCOPHAGE) 500 MG tablet Take 1 tablet (500 mg total) by mouth once daily - take with supper 90 tablet 1  . metoprolol succinate (TOPROL-XL) 25 MG XL tablet Take 1 tablet (25 mg total) by mouth once daily 90 tablet 3  . pen needle, diabetic (BD INSULIN PEN NEEDLE UF SHORT) 31 gauge x 5/16" needle Check blood sugar once daily. 100 each 11  . semaglutide (OZEMPIC) 0.25 mg or 0.5 mg(2  mg/1.5 mL) pen injector Inject 0.1875 mLs (0.25 mg total) subcutaneously once a week 1.5 mL 3   Allergies: Latex  Past Medical History: . Abnormal mammogram 02/16/2014  Prior Dr. Pat Patrick office ordered. Pt now elects PCP to order.  . Acute pyelonephritis 12/17/2019  . Bacteremia 12/17/2019  . Basal cell carcinoma of skin  . 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 COPD (chronic obstructive pulmonary disease) (CMS-HCC) 12/17/2019  . Diabetes mellitus, type 2 (CMS-HCC) 2013  . 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 Eczema, unspecified  . Essential (primary) hypertension 02/15/2019  . Fatty liver  . History of colonic polyps  . History of wheezing  Followed by Dr. Wende Neighbors (pulmonology)  . Hx of basal cell carcinoma  Dr. Nehemiah Massed  . Hyperlipidemia  . Kidney stone-history 07/30/2013  . Multinodular goiter 02/15/2019  . Multiple thyroid nodules  FNA of two nodules 11/2014- benign  . Nephrolithiasis 2017  . Obesity  . Platelets decreased (CMS-HCC)  . Rheumatoid factor positive 12/30/2018  Pt declines referral to rheum at this time. 12/2018  . 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 Supraventricular tachycardia (CMS-HCC) 05/03/2016  Dr Ubaldo Glassing did full work up and pt now takes beta blocker for control  . SVT (supraventricular tachycardia) (CMS-HCC)  . Tobacco abuse  . Type 2 diabetes mellitus with microalbuminuria, with long-term current use  of insulin (CMS-HCC) 02/15/2019   Past Surgical History: . CESAREAN SECTION 1989  . CESAREAN SECTION 1985  . CHOLECYSTECTOMY 2013  . COLONOSCOPY 02/11/2012, 11/26/2008  Dr. Eber Hong @ [facility] Adenomatous Polyps, rpt 5 yrs per [facility]  . COLONOSCOPY 01/15/2018  Normal/PHX of colon polyps/Repeat in 5 yrs/TKT  . HYSTERECTOMY (Partial, secondary to fibroids)  . MOUTH SURGERY  . Open left carpal tunnel release. Left 10/19/2020 (Dr. Joice Lofts)  . Open reduction and internal fixation, left distal radius  fracture with attempted volar plate fixation followed by dorsal joint spanning interal fixation from the index metacarpal to the radial shaft. Left 08/28/2020 Roger Shelter N. Holen, DO in [place])  . TUBAL LIGATION 1991  . UMBILICAL HERNIA REPAIR 2013   Social History:  Socioeconomic History  . Marital status: Married  Spouse name: Not on file  . Number of children: 2  . Years of education: Not on file  . Highest education level: Associate degree: occupational, Scientist, product/process development, or vocational program  Occupational History  . Not on file  Tobacco Use  . Smoking status: Former Smoker  Packs/day: 1.00  Years: 40.00  Pack years: 40.00  Types: Cigarettes  Quit date: 01/04/2015  Years since quitting: 5.8  . Smokeless tobacco: Never Used  Vaping Use  . Vaping Use: Never used  Substance and Sexual Activity  . Alcohol use: Yes  Comment: rarely (once a month)  . Drug use: No  . Sexual activity: Not Currently  Birth control/protection: Surgical  Other Topics Concern  . Not on file  Social History Narrative  . Not on file   Social Determinants of Health:   Financial Resource Strain: Not on file  Food Insecurity: Not on file  Transportation Needs: Not on file  Physical Activity: Not on file  Stress: Not on file  Social Connections: Not on file  Housing Stability: Not on file   Family History: [name] Myocardial Infarction (Heart attack) Mother  . High blood pressure (Hypertension) Mother  . Diabetes type II Mother  . Cancer Mother  . Osteoporosis (Thinning of bones) Mother  . Thyroid disease Mother  . Glaucoma Mother  . Anemia Mother  . Depression Mother  . Hyperlipidemia (Elevated cholesterol) Mother  . Osteoarthritis Mother  . Rheum arthritis Mother  . Stroke Mother  . High blood pressure (Hypertension) Father  . Myocardial Infarction (Heart attack) Father  . Diabetes type II Father  . Stroke Father  . Alcohol abuse Father  . COPD Father  . Heart disease Father  . Breast cancer Sister   . High blood pressure (Hypertension) Sister  . Diabetes Sister  . High blood pressure (Hypertension) Brother  . Diabetes Brother  . Kidney disease Brother (one kidney remains)  . Heart disease Brother (bypass)  . Seizures Daughter  . Anemia Daughter  . No Known Problems Maternal Grandmother  . Lung cancer Maternal Grandfather  . Diabetes Paternal Grandmother  . Parkinsonism Paternal Grandmother  . No Known Problems Paternal Grandfather  . Sleep apnea Sister  . Diabetes Sister  . High blood pressure (Hypertension) Sister  . Liver disease Sister  . Cervical cancer Sister  . Fibroids Sister  . Thyroid disease Sister  . Heart disease Sister  . Anxiety Sister  . High blood pressure (Hypertension) Sister  . No Known Problems Sister  . Diabetes Brother (COD from complications bg was 700)  . High blood pressure (Hypertension) Brother  . Post-traumatic stress disorder Son   Review of Systems: A comprehensive 14 point  ROS was performed, reviewed, and the pertinent orthopaedic findings are documented in the HPI.  Physical Exam: See preoperative nursing records for today's vital signs.  General/Constitutional: NAD, conversant Eyes: Pupils equal and round, extraocular movements intact ENT: atraumatic external nose and ears, moist mucous membranes Respiratory: non-labored breathing, symmetric chest rise, chest sounds clear. Cardiovascular: no visible lower extremity edema, peripheral pulses present, regular rate and rhythm  Skin: normal skin turgor, warm and dry Neurological: cranial nerves grossly intact, sensation grossly intact Psychological: Appropriate mood and affect; appropriate judgment Musculoskeletal: as detailed below:  General: Well developed, well nourished 65 y.o. female in no apparent distress. Normal affect. Normal communication. Patient answers questions appropriately. The patient has a normal gait. There is no antalgic component. There is no hip lurch.   Left  upper extremity: Examination of the left wrist reveals the incisions to be well-healed and without evidence for infection. There is mild diffuse swelling over the hand and wrist, as well as a slightly shiny appearance to the skin, possibly suggestive of early reflex and pathetic dystrophy.  Wrist range of motion is absent due to the spanning plate fixation. Finger and thumb range of motion is limited with testing, although she does demonstrate active flexion and extension of all digits. Grossly, she is neurovascularly intact to the left forearm and hand.  Impression: 1. Retained hardware status post application of spanning dorsal plate fixation of open fracture of left distal radius and ulna, type I or II.   Plan:  The treatment options were discussed with the patient. She would like to proceed with surgical intervention to include removal of the spanning dorsal plate. The procedure was discussed with the patient, as were the potential risks (including bleeding, infection, nerve and/or blood vessel injury, persistent or recurrent pain/paresthesias, malunion, non-union, stiffness of the wrist and/or fingers, need for further surgery, blood clots, strokes, heart attacks and/or arhythmias, pneumonia, etc.) and benefits. The patient states her understanding and wishes to proceed. All of the patient's questions and concerns were answered. She can call any time with further concerns. She will follow up post-surgery, routine.   H&P reviewed and patient re-examined. No changes.

## 2021-01-19 IMAGING — CT CT ABD-PELV W/ CM
2 of 5 series · 15 of 46 positions shown, 17 images · IV contrast (APPLIED)
Comparison: Noncontrast CT Abdomen and Pelvis 06/22/2016 and
earlier. CT abdomen and pelvis with contrast 06/11/2008.

CLINICAL DATA: 63-year-old female with right flank pain and fever.
History of kidney stones.

EXAM:
CT ABDOMEN AND PELVIS WITH CONTRAST
TECHNIQUE: Multidetector CT imaging of the abdomen and pelvis was performed
using the standard protocol following bolus administration of
intravenous contrast.
CONTRAST:  100mL OMNIPAQUE IOHEXOL 300 MG/ML  SOLN

[Series 2: axial st · axial · 0.69mm/px · z∈[-969,-544]mm · 12 of 97 slices shown, 14 images]
[im 6/97  soft-tissue]
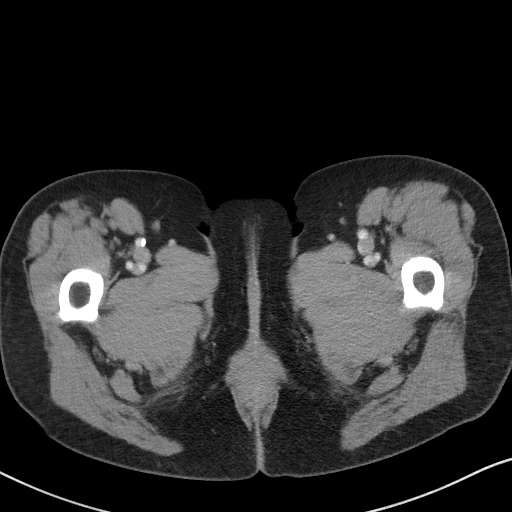
[im 6/97  bone]
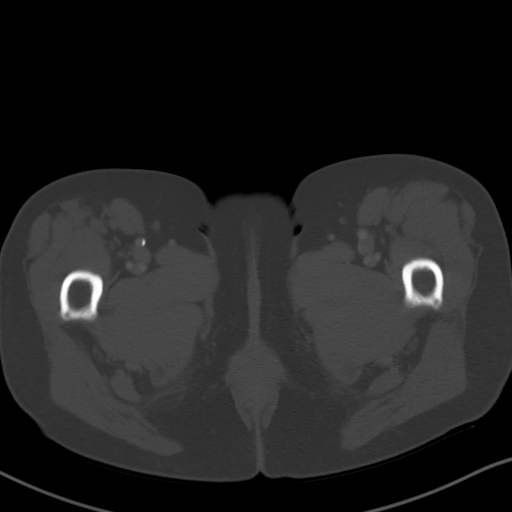
[im 16/97  soft-tissue]
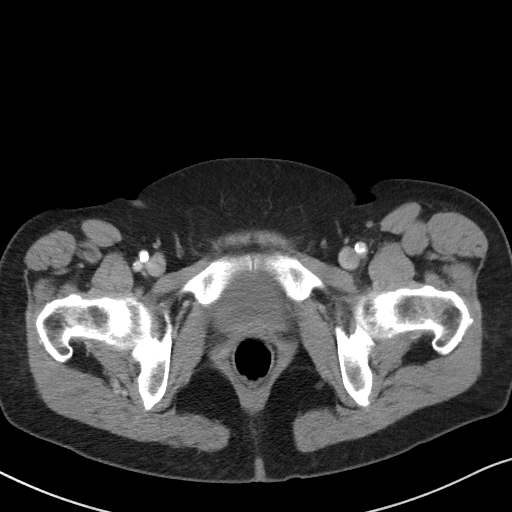
[im 21/97  soft-tissue]
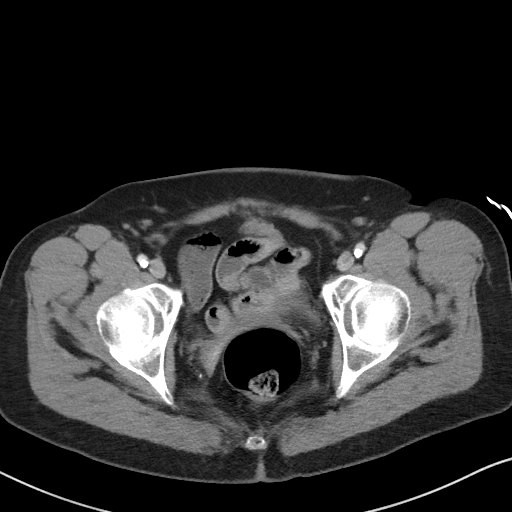
[im 31/97  soft-tissue]
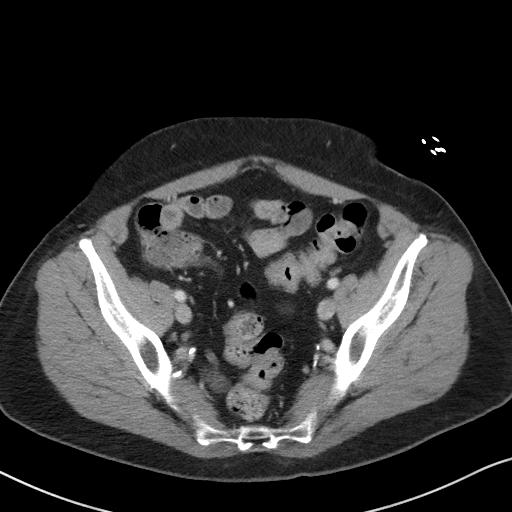
[im 36/97  soft-tissue]
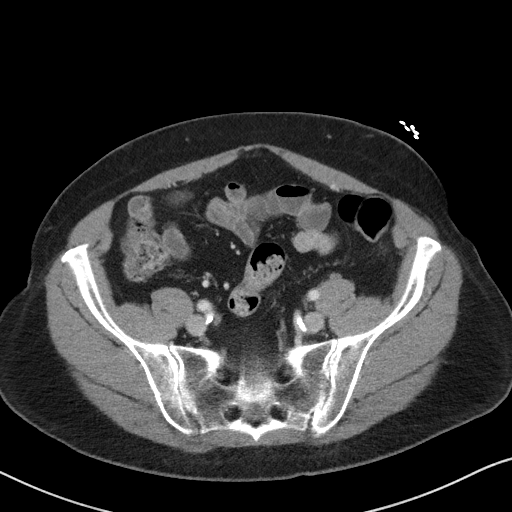
[im 46/97  soft-tissue]
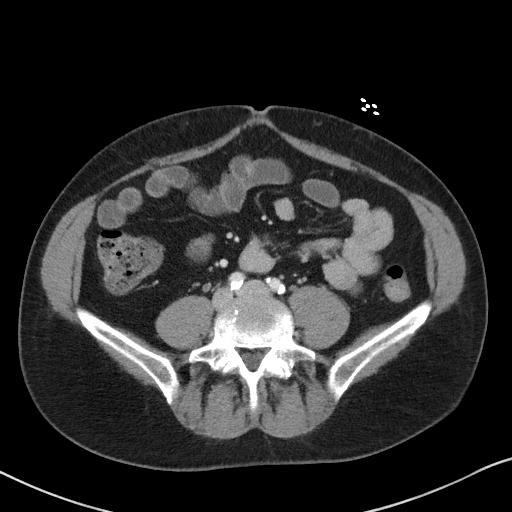
[im 51/97  soft-tissue]
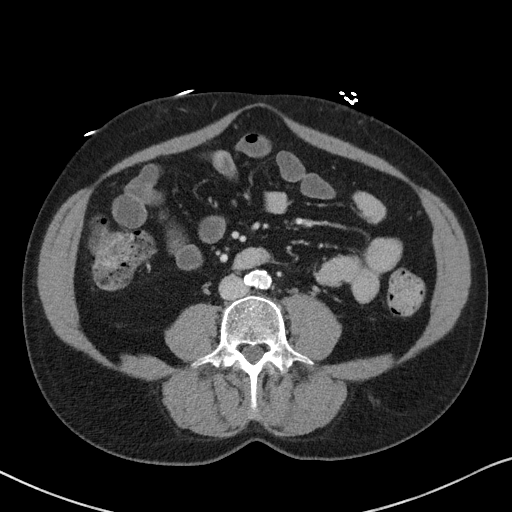
[im 61/97  soft-tissue]
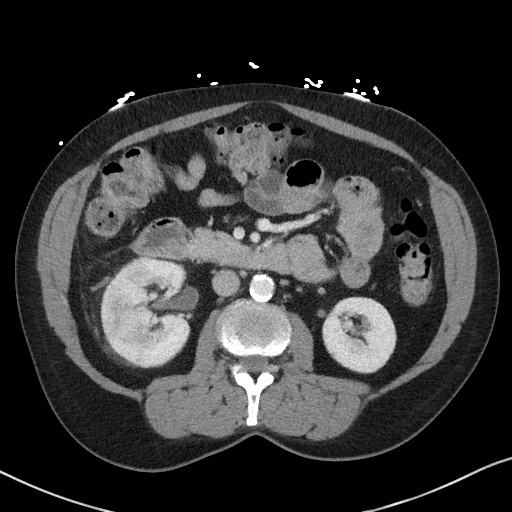
[im 66/97  soft-tissue]
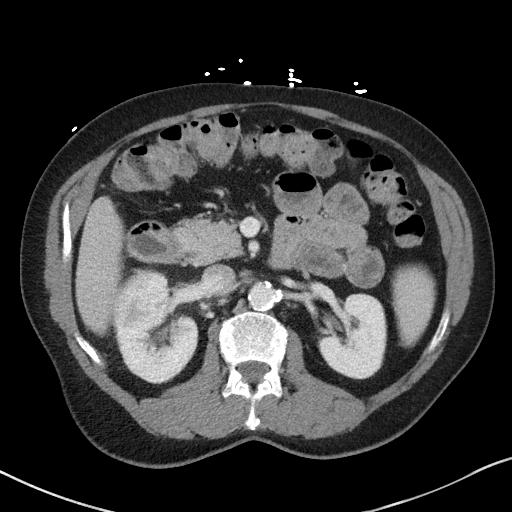
[im 66/97  bone]
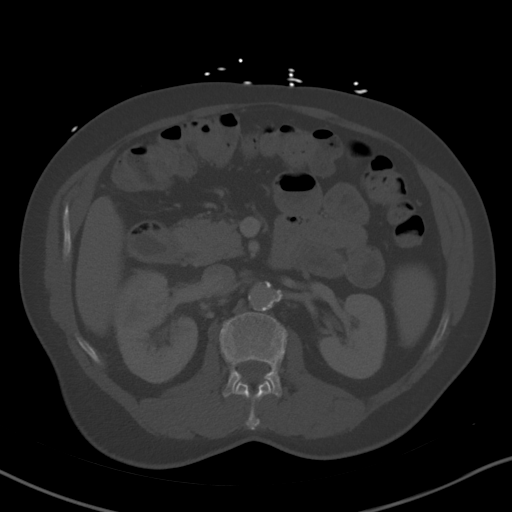
[im 76/97  soft-tissue]
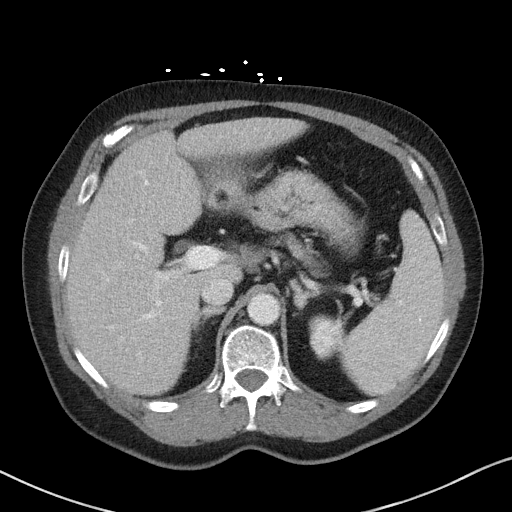
[im 81/97  soft-tissue]
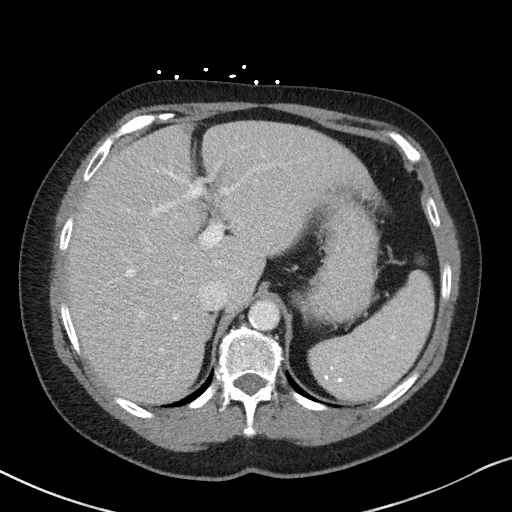
[im 91/97  soft-tissue]
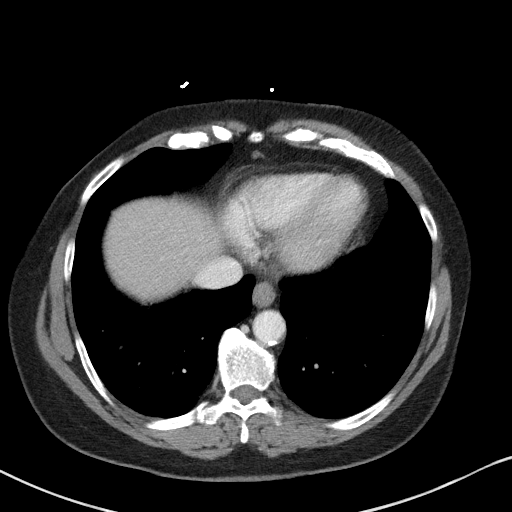

[Series 5: coronal st · coronal · 0.74mm/px · 3 of 89 slices shown]
[im 30/89  soft-tissue]
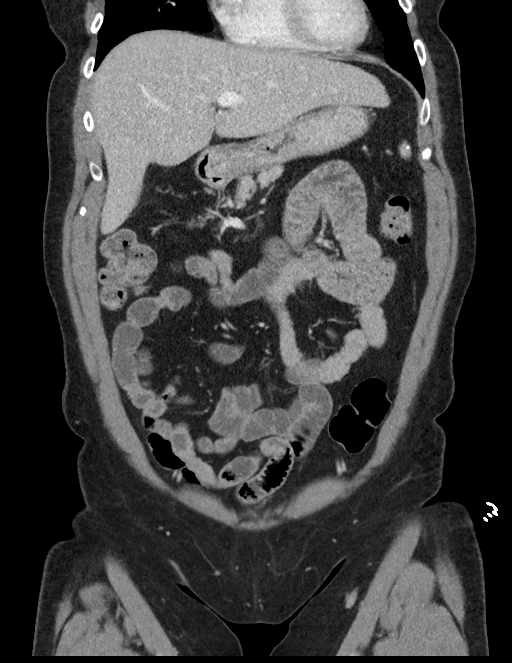
[im 40/89  soft-tissue]
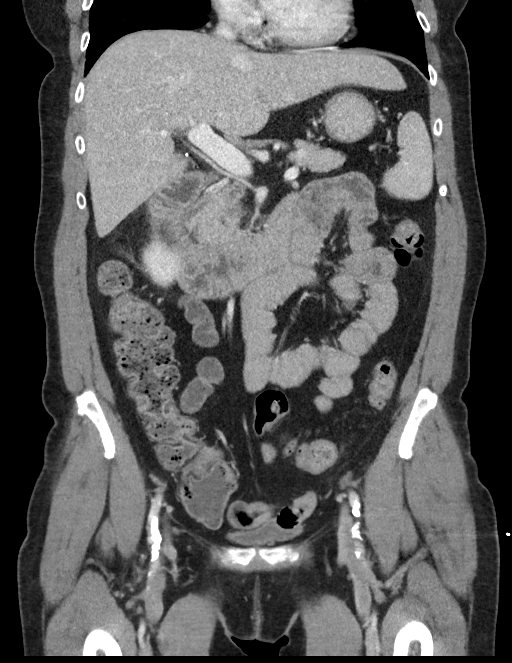
[im 49/89  soft-tissue]
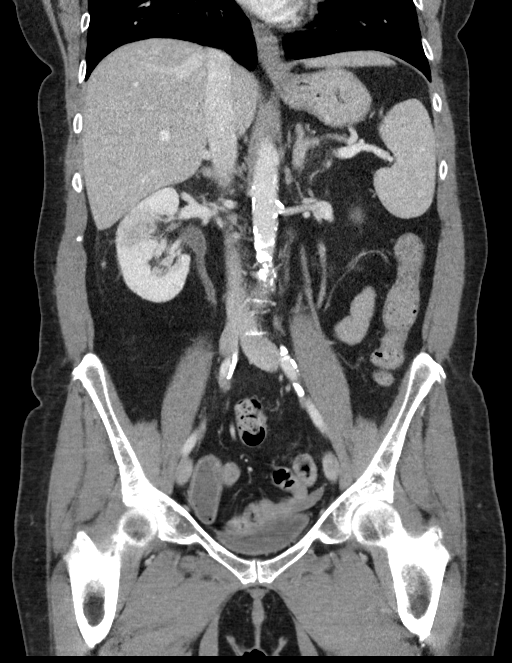

[15 of 46 positions shown; findings below may reference images not displayed]

FINDINGS: Lower chest: Stable mild atelectasis or scarring. No cardiomegaly,
pericardial or pleural effusion.

Hepatobiliary: Surgically absent gallbladder. Negative liver. No
bile duct enlargement.

Pancreas: Negative.

Spleen: Negative, scattered small calcified splenic granulomas.

Adrenals/Urinary Tract: Adrenal glands are normal.

Left renal enhancement and contrast excretion appears normal. The
left ureter appears normal.

There is asymmetrically heterogeneous enhancement of the right
kidney on both nephrographic and delayed phase images, with
associated perinephric stranding (series 2, image 36). No pararenal
fluid collection. There are punctate superimposed upper pole and
lower pole calculi on the right. No hydronephrosis, but urothelial
thickening and enhancement visible at the renal pelvis and along the
proximal right ureter. The distal right ureter appears more normal.
Diminutive and unremarkable urinary bladder.

Stomach/Bowel: Retained stool throughout much of the large bowel
which is redundant. The cecum is located in the right hemipelvis,
within normal appendix terminating on coronal image 65. No large
bowel inflammation. Negative terminal ileum. Fluid-filled but
nondilated small bowel throughout much of the abdomen. Decompressed
stomach. No free air. No free fluid.

Vascular/Lymphatic: Aortoiliac calcified atherosclerosis. Major
arterial structures remain patent. Atherosclerosis continues into
the proximal femoral arteries. Portal venous system is patent. No
lymphadenopathy.

Reproductive: Surgically absent uterus with diminutive or absent
ovaries.

Other: No pelvic free fluid.

Musculoskeletal: No acute osseous abnormality identified.
IMPRESSION: 1. Positive for Acute Right Pyelonephritis. No renal abscess or
complicating features. Superimposed nephrolithiasis.

2. Normal appendix. No other acute or inflammatory process
identified in the abdomen or pelvis.
3.  Aortic Atherosclerosis ([id number]-[id number].H).

## 2021-01-23 ENCOUNTER — Other Ambulatory Visit: Payer: Self-pay

## 2021-01-23 ENCOUNTER — Ambulatory Visit (INDEPENDENT_AMBULATORY_CARE_PROVIDER_SITE_OTHER): Payer: PRIVATE HEALTH INSURANCE | Admitting: Infectious Disease

## 2021-01-23 ENCOUNTER — Encounter: Payer: Self-pay | Admitting: Infectious Disease

## 2021-01-23 VITALS — BP 134/85 | HR 81 | Temp 97.0°F | Ht 63.0 in | Wt 156.0 lb

## 2021-01-23 DIAGNOSIS — Z85828 Personal history of other malignant neoplasm of skin: Secondary | ICD-10-CM | POA: Insufficient documentation

## 2021-01-23 DIAGNOSIS — T361X5A Adverse effect of cephalosporins and other beta-lactam antibiotics, initial encounter: Secondary | ICD-10-CM | POA: Insufficient documentation

## 2021-01-23 DIAGNOSIS — Z862 Personal history of diseases of the blood and blood-forming organs and certain disorders involving the immune mechanism: Secondary | ICD-10-CM | POA: Insufficient documentation

## 2021-01-23 DIAGNOSIS — Z8614 Personal history of Methicillin resistant Staphylococcus aureus infection: Secondary | ICD-10-CM | POA: Insufficient documentation

## 2021-01-23 DIAGNOSIS — M86632 Other chronic osteomyelitis, left radius and ulna: Secondary | ICD-10-CM

## 2021-01-23 DIAGNOSIS — Z86010 Personal history of colonic polyps: Secondary | ICD-10-CM | POA: Insufficient documentation

## 2021-01-23 DIAGNOSIS — T847XXA Infection and inflammatory reaction due to other internal orthopedic prosthetic devices, implants and grafts, initial encounter: Secondary | ICD-10-CM

## 2021-01-23 DIAGNOSIS — Z87898 Personal history of other specified conditions: Secondary | ICD-10-CM | POA: Insufficient documentation

## 2021-01-23 DIAGNOSIS — E119 Type 2 diabetes mellitus without complications: Secondary | ICD-10-CM | POA: Diagnosis not present

## 2021-01-23 DIAGNOSIS — Z8619 Personal history of other infectious and parasitic diseases: Secondary | ICD-10-CM

## 2021-01-23 DIAGNOSIS — L309 Dermatitis, unspecified: Secondary | ICD-10-CM | POA: Insufficient documentation

## 2021-01-23 DIAGNOSIS — M869 Osteomyelitis, unspecified: Secondary | ICD-10-CM

## 2021-01-23 DIAGNOSIS — Z8781 Personal history of (healed) traumatic fracture: Secondary | ICD-10-CM

## 2021-01-23 DIAGNOSIS — K760 Fatty (change of) liver, not elsewhere classified: Secondary | ICD-10-CM | POA: Insufficient documentation

## 2021-01-23 DIAGNOSIS — R7881 Bacteremia: Secondary | ICD-10-CM

## 2021-01-23 DIAGNOSIS — G5602 Carpal tunnel syndrome, left upper limb: Secondary | ICD-10-CM

## 2021-01-23 DIAGNOSIS — E669 Obesity, unspecified: Secondary | ICD-10-CM | POA: Insufficient documentation

## 2021-01-23 HISTORY — DX: Infection and inflammatory reaction due to other internal orthopedic prosthetic devices, implants and grafts, initial encounter: T84.7XXA

## 2021-01-23 HISTORY — DX: Osteomyelitis, unspecified: M86.9

## 2021-01-23 NOTE — Progress Notes (Signed)
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 Leretha Pol   Patient ID: Reginold Agent, female    DOB: [birth year], 65 y.o.   MRN: [id number]  HPI  Almyra Free is a 65 year old Caucasian female with history of prior MRSA infection who sustained a traumatic fall from a ladder in [place] on September 01, 2020.  The fracture required internal fixation and she was followed in [place] by Dr. Caprice Beaver.  Allie there was frequent drainage from the surgical sites and the patient says that she was on antibiotics for nearly 3 months of time.  In the interim in December 2021 Dr. Roland Rack performed carpal tunnel release on same hand  Ultimately she ended up having her hardware removed on 3 February.  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  Patient has continued to have drainage from her wound which is purulent sounding in appearance.  It has responded to Bactrim which she just completed a 10-day course.  She says when she takes the Bactrim it dries up the areas where the drainage is coming from.  She has severe pain in her hand though not at all times she can get her hand to do certain positions where does not in constant pain.  She has been referred to [place] for suspected osteomyelitis of the radius.  An MRI was ordered but she could not tolerate the positioning required in the machine.  She also has been referred to Dr. Marcelino Scot.  Is tolerated Augmentin in the past but once while given Keflex for her current problem she had immediate itchiness in her throat which then subsided.  Has had a gram-negative bacteremia associated with a complicated urinary tract infection in the past.  She does have comorbid diabetes mellitus . 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 Delaine Lame is literally leaving the country tomorrow for [place] for a few  weeks.  Past Medical History:  Diagnosis Date  . Cancer ([place])    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  . Cephalexin adverse reaction   . COPD (chronic obstructive pulmonary disease) ([place])   . Diabetes mellitus without complication ([place])   . Hardware complicating wound infection ([place]) 01/23/2021  . History of kidney stones   . HTN (hypertension)   . Hx MRSA infection   . Hx MRSA infection   . 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 PONV (postoperative nausea and vomiting)    ponv after recent carpal tunnell  . Renal calculi   . SVT (supraventricular tachycardia) ([facility])     Past Surgical History:  Procedure Laterality Date  . ABDOMINAL HYSTERECTOMY    . CESAREAN SECTION    . CHOLECYSTECTOMY    . COLONOSCOPY WITH PROPOFOL N/A 01/15/2018   Procedure: COLONOSCOPY WITH PROPOFOL;  Surgeon: Toledo, [name], MD;  Location: [facility] ENDOSCOPY;  Service: Gastroenterology;  Laterality: N/A;  . HARDWARE REMOVAL Left 10/19/2020   Procedure: OPEN CARPAL TUNNEL RELEASE OF LEFT WRIST;  Surgeon: Corky Mull, MD;  Location: [facility] ORS;  Service: Orthopedics;  Laterality: Left;  . HARDWARE REMOVAL Left 11/24/2020   Procedure: HARDWARE REMOVAL;  Surgeon: Corky Mull, MD;  Location: [facility] ORS;  Service: Orthopedics;  Laterality: Left;  . HERNIA REPAIR    . Oral surgery - all teeth removed      Family History  Problem Relation Age of Onset  .  Heart failure Mother   . Heart failure Father   . Breast cancer Sister 6      Social History   Socioeconomic History  . Marital status: Married    Spouse name: Not on file  . Number of children: Not on file  . Years of education: Not on file  . Highest education level: Not on file  Occupational History  . Not on file  Tobacco Use  . Smoking status: Current Every Day Smoker    Packs/day: 0.25    Types: Cigarettes  . Smokeless tobacco: Never Used  Vaping Use  . Vaping Use: Never used  Substance and Sexual Activity  . Alcohol use: No  . Drug use: No  .  Sexual activity: Not on file  Other Topics Concern  . Not on file  Social History Narrative  . Not on file   Social Determinants of Health   Financial Resource Strain: Not on file  Food Insecurity: Not on file  Transportation Needs: Not on file  Physical Activity: Not on file  Stress: Not on file  Social Connections: Not on file    Allergies  Allergen Reactions  . Latex Itching and Rash     Current Outpatient Medications:  .  atorvastatin (LIPITOR) 40 MG tablet, Take 40 mg by mouth at bedtime. , Disp: , Rfl:  .  Cholecalciferol (VITAMIN D3) 50 MCG (2000 UT) TABS, Take 2,000 Units by mouth daily., Disp: , Rfl:  .  CRANBERRY PO, Take 1 tablet by mouth daily as needed (urinary pain)., Disp: , Rfl:  .  enalapril (VASOTEC) 2.5 MG tablet, Take 2.5 mg by mouth at bedtime. , Disp: , Rfl:  .  gabapentin (NEURONTIN) 300 MG capsule, Take 1 capsule by mouth 3 (three) times daily., Disp: , Rfl:  .  HYDROcodone-acetaminophen (NORCO) 7.5-325 MG tablet, Take by mouth., Disp: , Rfl:  .  JARDIANCE 25 MG TABS tablet, Take 25 mg by mouth at bedtime., Disp: , Rfl: 4 .  LANTUS SOLOSTAR 100 UNIT/ML Solostar Pen, Inject 20 Units into the skin at bedtime. (Patient taking differently: Inject 40 Units into the skin at bedtime.), Disp: 15 mL, Rfl: 11 .  metFORMIN (GLUCOPHAGE) 500 MG tablet, Take 500 mg by mouth every evening., Disp: , Rfl:  .  metoprolol succinate (TOPROL-XL) 25 MG 24 hr tablet, Take 25 mg by mouth at bedtime., Disp: , Rfl:  .  OZEMPIC, 0.25 OR 0.5 MG/DOSE, 2 MG/1.5ML SOPN, Inject 0.5 mg into the skin every Monday., Disp: , Rfl:  .  vitamin B-12 (CYANOCOBALAMIN) 1000 MCG tablet, Take 1,000 mcg by mouth daily., Disp: , Rfl:    Review of Systems  Constitutional: Negative for activity change, appetite change, chills, diaphoresis, fatigue, fever and unexpected weight change.  HENT: Negative for congestion, rhinorrhea, sinus pressure, sneezing, sore throat and trouble swallowing.   Eyes:  Negative for photophobia and visual disturbance.  Respiratory: Negative for cough, chest tightness, shortness of breath, wheezing and stridor.   Cardiovascular: Negative for chest pain, palpitations and leg swelling.  Gastrointestinal: Negative for abdominal distention, abdominal pain, anal bleeding, blood in stool, constipation, diarrhea, nausea and vomiting.  Genitourinary: Negative for difficulty urinating, dysuria, flank pain and hematuria.  Musculoskeletal: Positive for joint swelling and myalgias. Negative for arthralgias, back pain and gait problem.  Skin: Positive for color change and wound. Negative for pallor and rash.  Neurological: Negative for dizziness, tremors, weakness and light-headedness.  Hematological: Negative for adenopathy. Does not bruise/bleed easily.  Psychiatric/Behavioral: Negative  for agitation, behavioral problems, confusion, decreased concentration, dysphoric mood and sleep disturbance.       Objective:   Physical Exam Constitutional:      General: She is not in acute distress.    Appearance: Normal appearance. She is well-developed. She is not ill-appearing or diaphoretic.  HENT:     Head: Normocephalic and atraumatic.     Right Ear: Hearing and external ear normal.     Left Ear: Hearing and external ear normal.     Nose: No nasal deformity or rhinorrhea.  Eyes:     General: No scleral icterus.    Conjunctiva/sclera: Conjunctivae normal.     Right eye: Right conjunctiva is not injected.     Left eye: Left conjunctiva is not injected.     Pupils: Pupils are equal, round, and reactive to light.  Neck:     Vascular: No JVD.  Cardiovascular:     Rate and Rhythm: Normal rate and regular rhythm.     Heart sounds: S1 normal and S2 normal.  Pulmonary:     Effort: Pulmonary effort is normal. No respiratory distress.  Abdominal:     General: Bowel sounds are normal. There is no distension.     Palpations: Abdomen is soft.     Tenderness: There is no  abdominal tenderness.  Musculoskeletal:        General: Normal range of motion.     Right shoulder: Normal.     Left shoulder: Normal.     Cervical back: Normal range of motion and neck supple.     Right hip: Normal.     Left hip: Normal.     Right knee: Normal.     Left knee: Normal.  Lymphadenopathy:     Head:     Right side of head: No submandibular, preauricular or posterior auricular adenopathy.     Left side of head: No submandibular, preauricular or posterior auricular adenopathy.     Cervical: No cervical adenopathy.     Right cervical: No superficial or deep cervical adenopathy.    Left cervical: No superficial or deep cervical adenopathy.  Skin:    General: Skin is warm and dry.     Coloration: Skin is not pale.     Findings: No abrasion, bruising, ecchymosis, erythema, lesion or rash.     Nails: There is no clubbing.  Neurological:     General: No focal deficit present.     Mental Status: She is alert and oriented to person, place, and time.     Sensory: No sensory deficit.     Coordination: Coordination normal.     Gait: Gait normal.  Psychiatric:        Attention and Perception: She is attentive.        Mood and Affect: Mood normal.        Speech: Speech normal.        Behavior: Behavior normal. Behavior is cooperative.        Thought Content: Thought content normal.        Judgment: Judgment normal.     Left wrist 01/23/2021:             Assessment & Plan:   Likely osteomyelitis involving the radius where she had a fracture and prior hardware that was in place  I will coordinate with Dr. Marcelino Scot.  Hopefully he can take her to the operating room and get deep intraoperative cultures that we can then used to target microbes with specific antimicrobials.  Her intolerance of cephalexin will handicap my ability to use cephalosporins for her.  To target MRSA empirically I would use daptomycin but I do not want to use any antibiotics until we have cultures  taken in the operating room.  She will also need placement of with of a PICC line and should be treated for at least 6 weeks with intravenous antibiotics.

## 2021-01-23 NOTE — Patient Instructions (Signed)
We will coordinate with Dr. Marcelino Scot re PICC line placement, antibiotic choices and followup

## 2023-08-21 ENCOUNTER — Other Ambulatory Visit: Payer: Self-pay | Admitting: Family Medicine

## 2023-08-21 DIAGNOSIS — Z1231 Encounter for screening mammogram for malignant neoplasm of breast: Secondary | ICD-10-CM

## 2023-09-04 ENCOUNTER — Ambulatory Visit
Admission: RE | Admit: 2023-09-04 | Discharge: 2023-09-04 | Disposition: A | Discharge status: Home / Self Care | Payer: Medicare Other | Source: Ambulatory Visit | Attending: Family Medicine | Admitting: Family Medicine

## 2023-09-04 DIAGNOSIS — Z1231 Encounter for screening mammogram for malignant neoplasm of breast: Secondary | ICD-10-CM | POA: Diagnosis present

## 2023-10-09 ENCOUNTER — Encounter
Admission: RE | Disposition: A | Discharge status: Home / Self Care | Payer: Self-pay | Source: Home / Self Care | Attending: Internal Medicine

## 2023-10-09 ENCOUNTER — Ambulatory Visit
Admission: RE | Admit: 2023-10-09 | Discharge: 2023-10-09 | Disposition: A | Discharge status: Home / Self Care | Payer: Medicare Other | Attending: Internal Medicine | Admitting: Internal Medicine

## 2023-10-09 ENCOUNTER — Ambulatory Visit: Payer: Medicare Other | Admitting: General Practice

## 2023-10-09 DIAGNOSIS — Z7984 Long term (current) use of oral hypoglycemic drugs: Secondary | ICD-10-CM | POA: Insufficient documentation

## 2023-10-09 DIAGNOSIS — Z794 Long term (current) use of insulin: Secondary | ICD-10-CM | POA: Insufficient documentation

## 2023-10-09 DIAGNOSIS — E119 Type 2 diabetes mellitus without complications: Secondary | ICD-10-CM | POA: Diagnosis not present

## 2023-10-09 DIAGNOSIS — I10 Essential (primary) hypertension: Secondary | ICD-10-CM | POA: Diagnosis not present

## 2023-10-09 DIAGNOSIS — Z7985 Long-term (current) use of injectable non-insulin antidiabetic drugs: Secondary | ICD-10-CM | POA: Insufficient documentation

## 2023-10-09 DIAGNOSIS — K641 Second degree hemorrhoids: Secondary | ICD-10-CM | POA: Insufficient documentation

## 2023-10-09 DIAGNOSIS — J449 Chronic obstructive pulmonary disease, unspecified: Secondary | ICD-10-CM | POA: Diagnosis not present

## 2023-10-09 DIAGNOSIS — Z1211 Encounter for screening for malignant neoplasm of colon: Secondary | ICD-10-CM | POA: Insufficient documentation

## 2023-10-09 DIAGNOSIS — D124 Benign neoplasm of descending colon: Secondary | ICD-10-CM | POA: Insufficient documentation

## 2023-10-09 HISTORY — PX: COLONOSCOPY WITH PROPOFOL: SHX5780

## 2023-10-09 HISTORY — PX: POLYPECTOMY: SHX5525

## 2023-10-09 LAB — GLUCOSE, CAPILLARY: Glucose-Capillary: 108 mg/dL — ABNORMAL HIGH (ref 70–99)

## 2023-10-09 SURGERY — COLONOSCOPY WITH PROPOFOL
Anesthesia: General

## 2023-10-09 MED ORDER — LIDOCAINE HCL (PF) 2 % IJ SOLN
INTRAMUSCULAR | Status: AC
  Filled 2023-10-09: qty 5

## 2023-10-09 MED ORDER — PHENYLEPHRINE HCL (PRESSORS) 10 MG/ML IV SOLN
INTRAVENOUS | Status: DC | PRN
  Administered 2023-10-09: 80 ug via INTRAVENOUS

## 2023-10-09 MED ORDER — PROPOFOL 500 MG/50ML IV EMUL
INTRAVENOUS | Status: DC | PRN
  Administered 2023-10-09: 75 ug/kg/min via INTRAVENOUS

## 2023-10-09 MED ORDER — LIDOCAINE HCL (CARDIAC) PF 100 MG/5ML IV SOSY
PREFILLED_SYRINGE | INTRAVENOUS | Status: DC | PRN
  Administered 2023-10-09: 60 mg via INTRAVENOUS

## 2023-10-09 MED ORDER — SODIUM CHLORIDE 0.9 % IV SOLN
INTRAVENOUS | Status: DC
Start: 2023-10-09 — End: 2023-10-09
  Administered 2023-10-09: 20 mL/h via INTRAVENOUS

## 2023-10-09 MED ORDER — PROPOFOL 10 MG/ML IV BOLUS
INTRAVENOUS | Status: DC | PRN
  Administered 2023-10-09: 50 mg via INTRAVENOUS

## 2023-10-09 NOTE — Transfer of Care (Signed)
Immediate Anesthesia Transfer of Care Note  Patient: Dawn Farrell  Procedure(s) Performed: COLONOSCOPY WITH PROPOFOL POLYPECTOMY  Patient Location: PACU  Anesthesia Type:General  Level of Consciousness: sedated  Airway & Oxygen Therapy: Patient Spontanous Breathing  Post-op Assessment: Report given to RN and Post -op Vital signs reviewed and stable  Post vital signs: Reviewed and stable  Last Vitals:  Vitals Value Taken Time  BP 98/66 10/09/23 1136  Temp    Pulse 78 10/09/23 1137  Resp    SpO2 99 % 10/09/23 1137  Vitals shown include unfiled device data.  Last Pain:  Vitals:   10/09/23 1044  TempSrc: Temporal  PainSc: 0-No pain         Complications: No notable events documented.

## 2023-10-09 NOTE — Op Note (Signed)
[facility] Patient Name: Dawn Farrell Procedure Date: 10/09/2023 11:17 AM MRN: [id number] Account #: [id number] Date of Birth: 04-Jun-1956 Admit Type: Outpatient Age: 67 Room: [facility] ENDO ROOM 2 Gender: Female Note Status: Finalized Instrument Name: Prentice Docker [id number] Procedure:             Colonoscopy Indications:           Surveillance: Personal history of adenomatous polyps                         on last colonoscopy > 5 years ago Providers:             Royce Macadamia K. Jaizon Deroos MD, MD Medicines:             Propofol per Anesthesia Complications:         No immediate complications. Estimated blood loss:                         Minimal. Procedure:             Pre-Anesthesia Assessment:                        - The risks and benefits of the procedure and the                         sedation options and risks were discussed with the                         patient. All questions were answered and informed                         consent was obtained.                        - Patient identification and proposed procedure were                         verified prior to the procedure by the nurse. The                         procedure was verified in the procedure room.                        After obtaining informed consent, the colonoscope was                         passed under direct vision. Throughout the procedure,                         the patient's blood pressure, pulse, and oxygen                         saturations were monitored continuously. The                         Colonoscope was introduced through the anus and                         advanced to the the cecum, identified by the  appendiceal orifice, ileocecal valve and palpation.                         The colonoscopy was performed without difficulty. The                         patient tolerated the procedure well. The quality of                         the bowel  preparation was good. The ileocecal valve,                         appendiceal orifice, and rectum were photographed. Findings:      The perianal and digital rectal examinations were normal. Pertinent       negatives include normal sphincter tone and no palpable rectal lesions.      Non-bleeding internal hemorrhoids were found during retroflexion. The       hemorrhoids were Grade II (internal hemorrhoids that prolapse but reduce       spontaneously).      A 5 mm polyp was found in the descending colon. The polyp was sessile.       The polyp was removed with a jumbo cold forceps. Resection and retrieval       were complete.      The exam was otherwise without abnormality. Impression:            - Non-bleeding internal hemorrhoids.                        - One 5 mm polyp in the descending colon, removed with                         a jumbo cold forceps. Resected and retrieved.                        - The examination was otherwise normal. Recommendation:        - Patient has a contact number available for                         emergencies. The signs and symptoms of potential                         delayed complications were discussed with the patient.                         Return to normal activities tomorrow. Written                         discharge instructions were provided to the patient.                        - Resume previous diet.                        - Continue present medications.                        - Repeat colonoscopy is recommended for surveillance.  The colonoscopy date will be determined after                         pathology results from today's exam become available                         for review.                        - Return to GI office PRN.                        - The findings and recommendations were discussed with                         the patient. Procedure Code(s):     --- Professional ---                        [phone],  Colonoscopy, flexible; with biopsy, single or                         multiple Diagnosis Code(s):     --- Professional ---                        K64.1, Second degree hemorrhoids                        D12.4, Benign neoplasm of descending colon                        Z86.010, Personal history of colonic polyps CPT copyright 2022 American [employer] Association. All rights reserved. The codes documented in this report are preliminary and upon coder review may  be revised to meet current compliance requirements. Stanton Kidney MD, MD 10/09/2023 11:34:43 AM This report has been signed electronically. Number of Addenda: 0 Note Initiated On: 10/09/2023 11:17 AM Scope Withdrawal Time: 0 hours 5 minutes 40 seconds  Total Procedure Duration: 0 hours 8 minutes 47 seconds  Estimated Blood Loss:  Estimated blood loss was minimal. Estimated blood loss                         was minimal.      [facility]

## 2023-10-09 NOTE — Interval H&P Note (Signed)
History and Physical Interval Note:  10/09/2023 10:32 AM  Dawn Farrell  has presented today for surgery, with the diagnosis of Z86.0100 (ICD-10-CM) - History of colon polyps.  The various methods of treatment have been discussed with the patient and family. After consideration of risks, benefits and other options for treatment, the patient has consented to  Procedure(s) with comments: COLONOSCOPY WITH PROPOFOL (N/A) - DM as a surgical intervention.  The patient's history has been reviewed, patient examined, no change in status, stable for surgery.  I have reviewed the patient's chart and labs.  Questions were answered to the patient's satisfaction.     [place], Meyer

## 2023-10-09 NOTE — H&P (Signed)
Outpatient short stay form Pre-procedure 10/09/2023 10:31 AM Stacia Feazell K. Norma Fredrickson, M.D.  Primary Physician: Leim Fabry, M.D.  Reason for visit:  Personal history of adenomatous colon polyps > 62yrs ago.  History of present illness:                           Patient presents for colonoscopy for a personal hx of colon polyps. The patient denies abdominal pain, abnormal weight loss or rectal bleeding.     No current facility-administered medications for this encounter.  Medications Prior to Admission  Medication Sig Dispense Refill Last Dose/Taking   atorvastatin (LIPITOR) 40 MG tablet Take 40 mg by mouth at bedtime.       Cholecalciferol (VITAMIN D3) 50 MCG (2000 UT) TABS Take 2,000 Units by mouth daily.      CRANBERRY PO Take 1 tablet by mouth daily as needed (urinary pain).      enalapril (VASOTEC) 2.5 MG tablet Take 2.5 mg by mouth at bedtime.       gabapentin (NEURONTIN) 300 MG capsule Take 1 capsule by mouth 3 (three) times daily.      HYDROcodone-acetaminophen (NORCO) 7.5-325 MG tablet Take by mouth.      JARDIANCE 25 MG TABS tablet Take 25 mg by mouth at bedtime.  4    LANTUS SOLOSTAR 100 UNIT/ML Solostar Pen Inject 20 Units into the skin at bedtime. (Patient taking differently: Inject 40 Units into the skin at bedtime.) 15 mL 11    metFORMIN (GLUCOPHAGE) 500 MG tablet Take 500 mg by mouth every evening.      metoprolol succinate (TOPROL-XL) 25 MG 24 hr tablet Take 25 mg by mouth at bedtime.      OZEMPIC, 0.25 OR 0.5 MG/DOSE, 2 MG/1.5ML SOPN Inject 0.5 mg into the skin every Monday.      vitamin B-12 (CYANOCOBALAMIN) 1000 MCG tablet Take 1,000 mcg by mouth daily.        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    Physical Exam  Gen: Alert, oriented. Appears stated age.  HEENT: Garza-[place] II/AT. PERRLA. Lungs: CTA, no wheezes. CV: RR nl S1, S2. Abd: soft, benign, no masses. BS+ Ext: No edema. Pulses 2+    Planned procedures: Proceed with colonoscopy. The patient understands the nature of the planned procedure, indications, risks, alternatives and potential complications including but not limited to bleeding, infection, perforation, damage to internal organs and possible oversedation/side effects from anesthesia. The patient agrees and gives consent to proceed.  Please refer to procedure notes for findings, recommendations and patient disposition/instructions.     Tyrece Vanterpool K. Norma Fredrickson, M.D. Gastroenterology 10/09/2023  10:31 AM

## 2023-10-09 NOTE — Anesthesia Preprocedure Evaluation (Signed)
Anesthesia Evaluation  Patient identified by MRN, date of birth, ID band Patient awake    Reviewed: Allergy & Precautions, H&P , NPO status , Patient's Chart, lab work & pertinent test results  History of Anesthesia Complications (+) PONV and history of anesthetic complications  Airway Mallampati: II       Dental  (+) Edentulous Upper   Pulmonary COPD, Current Smoker and Patient abstained from smoking.   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 No date: COPD (chronic obstructive pulmonary disease) (HCC) No date: Diabetes mellitus without complication (HCC) No date: History of kidney stones No date: HTN (hypertension) No date: Hx MRSA infection No date: PONV (postoperative nausea and vomiting)     Comment:  ponv after recent carpal tunnell No date: Renal calculi No date: SVT (supraventricular tachycardia) (HCC)   Reproductive/Obstetrics negative OB ROS                             Anesthesia Physical Anesthesia Plan  ASA: 3  Anesthesia Plan: General   Post-op Pain Management: Minimal or no pain anticipated   Induction: Intravenous  PONV Risk Score and Plan: 3 and Propofol infusion, TIVA and Ondansetron  Airway Management Planned: Nasal Cannula  Additional Equipment: None  Intra-op Plan:   Post-operative Plan:   Informed Consent: I have reviewed the patients History and Physical, chart, labs and  discussed the procedure including the risks, benefits and alternatives for the proposed anesthesia with the patient or authorized representative who has indicated his/her understanding and acceptance.     Dental advisory given  Plan Discussed with: CRNA and Surgeon  Anesthesia Plan Comments: (Discussed risks of anesthesia with patient, including possibility of difficulty with spontaneous ventilation under anesthesia necessitating airway intervention, PONV, and rare risks such as cardiac or respiratory or neurological events, and allergic reactions. Discussed the role of CRNA in patient's perioperative care. Patient understands.)       Anesthesia Quick Evaluation

## 2023-10-09 NOTE — Anesthesia Postprocedure Evaluation (Signed)
Anesthesia Post Note  Patient: Dawn Farrell  Procedure(s) Performed: COLONOSCOPY WITH PROPOFOL POLYPECTOMY  Patient location during evaluation: Endoscopy Anesthesia Type: General Level of consciousness: awake and alert Pain management: pain level controlled Vital Signs Assessment: post-procedure vital signs reviewed and stable Respiratory status: spontaneous breathing, nonlabored ventilation, respiratory function stable and patient connected to nasal cannula oxygen Cardiovascular status: blood pressure returned to baseline and stable Postop Assessment: no apparent nausea or vomiting Anesthetic complications: no  No notable events documented.   Last Vitals:  Vitals:   10/09/23 1044  BP: (!) 104/58  Pulse: 88  Resp: 20  Temp: (!) 35.6 C  SpO2: 100%    Last Pain:  Vitals:   10/09/23 1044  TempSrc: Temporal  PainSc: 0-No pain                 Stephanie Coup

## 2023-10-10 ENCOUNTER — Encounter: Payer: Self-pay | Admitting: Internal Medicine

## 2023-10-10 LAB — SURGICAL PATHOLOGY

## 2024-05-18 ENCOUNTER — Other Ambulatory Visit: Payer: Self-pay | Admitting: Family Medicine

## 2024-05-18 DIAGNOSIS — Z780 Asymptomatic menopausal state: Secondary | ICD-10-CM

## 2024-05-28 ENCOUNTER — Other Ambulatory Visit: Payer: Self-pay | Admitting: Family Medicine

## 2024-05-28 DIAGNOSIS — F17200 Nicotine dependence, unspecified, uncomplicated: Secondary | ICD-10-CM

## 2024-05-28 DIAGNOSIS — Z122 Encounter for screening for malignant neoplasm of respiratory organs: Secondary | ICD-10-CM

## 2024-05-28 DIAGNOSIS — F17210 Nicotine dependence, cigarettes, uncomplicated: Secondary | ICD-10-CM

## 2024-06-16 ENCOUNTER — Ambulatory Visit
Admission: RE | Admit: 2024-06-16 | Discharge: 2024-06-16 | Disposition: A | Discharge status: Home / Self Care | Payer: PRIVATE HEALTH INSURANCE | Source: Ambulatory Visit | Attending: Family Medicine | Admitting: Family Medicine

## 2024-06-16 DIAGNOSIS — Z122 Encounter for screening for malignant neoplasm of respiratory organs: Secondary | ICD-10-CM | POA: Insufficient documentation

## 2024-06-16 DIAGNOSIS — F17210 Nicotine dependence, cigarettes, uncomplicated: Secondary | ICD-10-CM | POA: Insufficient documentation

## 2024-06-16 DIAGNOSIS — F17200 Nicotine dependence, unspecified, uncomplicated: Secondary | ICD-10-CM | POA: Insufficient documentation

## 2024-07-02 ENCOUNTER — Ambulatory Visit
Admission: RE | Admit: 2024-07-02 | Discharge: 2024-07-02 | Disposition: A | Discharge status: Home / Self Care | Payer: PRIVATE HEALTH INSURANCE | Source: Ambulatory Visit | Attending: Family Medicine | Admitting: Family Medicine

## 2024-07-02 DIAGNOSIS — Z780 Asymptomatic menopausal state: Secondary | ICD-10-CM | POA: Diagnosis present

## 2024-07-14 ENCOUNTER — Other Ambulatory Visit: Payer: Self-pay | Admitting: Ophthalmology

## 2024-07-14 DIAGNOSIS — H3582 Retinal ischemia: Secondary | ICD-10-CM

## 2024-07-17 ENCOUNTER — Ambulatory Visit

## 2024-07-21 ENCOUNTER — Ambulatory Visit
Admission: RE | Admit: 2024-07-21 | Discharge: 2024-07-21 | Disposition: A | Discharge status: Home / Self Care | Source: Ambulatory Visit | Attending: Ophthalmology | Admitting: Ophthalmology

## 2024-07-21 DIAGNOSIS — H3582 Retinal ischemia: Secondary | ICD-10-CM | POA: Insufficient documentation

## 2024-07-23 ENCOUNTER — Encounter (INDEPENDENT_AMBULATORY_CARE_PROVIDER_SITE_OTHER): Payer: Self-pay | Admitting: Vascular Surgery

## 2024-07-25 DIAGNOSIS — G458 Other transient cerebral ischemic attacks and related syndromes: Secondary | ICD-10-CM | POA: Insufficient documentation

## 2024-07-25 DIAGNOSIS — I6529 Occlusion and stenosis of unspecified carotid artery: Secondary | ICD-10-CM | POA: Insufficient documentation

## 2024-07-25 NOTE — Progress Notes (Signed)
 MRN : [id number]  Dawn Farrell is a 68 y.o. (03-25-1956) female who presents with chief complaint of check carotid arteries.  History of Present Illness:   The patient is seen for evaluation of carotid stenosis. The carotid stenosis was identified after visual changes.  Duplex ultrasound of the carotid arteries dated July 21, 2024 demonstrates less than 50% bilateral internal carotid artery stenosis.  Subclavian steal on the right is noted with reversal of the vertebral artery flow.  The patient denies amaurosis fugax. There is no recent history of TIA symptoms or focal motor deficits. There is no prior documented CVA.  There is no history of migraine headaches. There is no history of seizures.  The patient is taking enteric-coated aspirin  81 mg daily.  No recent shortening of the patient's walking distance or new symptoms consistent with claudication.  No history of rest pain symptoms. No new ulcers or wounds of the lower extremities have occurred.  There is no history of DVT, PE or superficial thrombophlebitis. No recent episodes of angina or shortness of breath documented.   No outpatient medications have been marked as taking for the 07/27/24 encounter (Appointment) with Jama, Cordella MATSU, MD.    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   PONV (postoperative nausea and vomiting)    ponv after recent carpal tunnell   Renal calculi    SVT (supraventricular tachycardia)     Past Surgical History:  Procedure Laterality Date   ABDOMINAL HYSTERECTOMY     CESAREAN SECTION     CHOLECYSTECTOMY      COLONOSCOPY WITH PROPOFOL  N/A 01/15/2018   Procedure: COLONOSCOPY WITH PROPOFOL ;  Surgeon: Toledo, Ladell POUR, MD;  Location: [facility] ENDOSCOPY;  Service: Gastroenterology;  Laterality: N/A;   COLONOSCOPY WITH PROPOFOL  N/A 10/09/2023   Procedure: COLONOSCOPY WITH PROPOFOL ;  Surgeon: Toledo, Ladell POUR, MD;  Location: [facility] ENDOSCOPY;  Service: Gastroenterology;  Laterality: N/A;  DM   HARDWARE REMOVAL Left 10/19/2020   Procedure: OPEN CARPAL TUNNEL RELEASE OF LEFT WRIST;  Surgeon: Edie Norleen PARAS, MD;  Location: [facility] ORS;  Service: Orthopedics;  Laterality: Left;   HARDWARE REMOVAL Left 11/24/2020   Procedure: HARDWARE REMOVAL;  Surgeon: Edie Norleen PARAS, MD;  Location: [facility] ORS;  Service: Orthopedics;  Laterality: Left;   HERNIA REPAIR     Oral surgery - all teeth removed     POLYPECTOMY  10/09/2023   Procedure: POLYPECTOMY;  Surgeon: Toledo, Ladell POUR, MD;  Location: [facility] ENDOSCOPY;  Service: Gastroenterology;;    Social History Social History   Tobacco Use   Smoking status: Every Day    Current packs/day: 0.25    Types: Cigarettes   Smokeless tobacco: Never  Vaping Use   Vaping status: Never Used  Substance Use Topics   Alcohol use: No   Drug use: No    Family History Family History  Problem Relation Age of Onset   Heart failure Mother    Heart failure Father  Breast cancer Sister 95    Allergies  Allergen Reactions   Latex Itching and Rash     REVIEW OF SYSTEMS (Negative unless checked)  Constitutional: [] Weight loss  [] Fever  [] Chills Cardiac: [] Chest pain   [] Chest pressure   [] Palpitations   [] Shortness of breath when laying flat   [] Shortness of breath with exertion. Vascular:  [x] Pain in legs with walking   [] Pain in legs at rest  [] History of DVT   [] Phlebitis   [] Swelling in legs   [] Varicose veins   [] Non-healing ulcers Pulmonary:   [] Uses home oxygen   [] Productive cough   [] Hemoptysis   [] Wheeze  [] COPD   [] Asthma Neurologic:  [] Dizziness   [] Seizures   [] History  of stroke   [] History of TIA  [] Aphasia   [] Vissual changes   [] Weakness or numbness in arm   [] Weakness or numbness in leg Musculoskeletal:   [] Joint swelling   [] Joint pain   [] Low back pain Hematologic:  [] Easy bruising  [] Easy bleeding   [] Hypercoagulable state   [] Anemic Gastrointestinal:  [] Diarrhea   [] Vomiting  [] Gastroesophageal reflux/heartburn   [] Difficulty swallowing. Genitourinary:  [] Chronic kidney disease   [] Difficult urination  [] Frequent urination   [] Blood in urine Skin:  [] Rashes   [] Ulcers  Psychological:  [] History of anxiety   []  History of major depression.  Physical Examination  There were no vitals filed for this visit. There is no height or weight on file to calculate BMI. Gen: WD/WN, NAD Head: [place]/AT, No temporalis wasting.  Ear/Nose/Throat: Hearing grossly intact, nares w/o erythema or drainage Eyes: PER, EOMI, sclera nonicteric.  Neck: Supple, no masses.  No bruit or JVD.  Pulmonary:  Good air movement, no audible wheezing, no use of accessory muscles.  Cardiac: RRR, normal S1, S2, no Murmurs. Vascular:  carotid bruit noted Vessel Right Left  Radial Palpable Palpable  Carotid  Not Palpable  Palpable  Subclav  Not Palpable Palpable  Gastrointestinal: soft, non-distended. No guarding/no peritoneal signs.  Musculoskeletal: M/S 5/5 throughout.  No visible deformity.  Neurologic: CN 2-12 intact. Pain and light touch intact in extremities.  Symmetrical.  Speech is fluent. Motor exam as listed above. Psychiatric: Judgment intact, Mood & affect appropriate for pt's clinical situation. Dermatologic: No rashes or ulcers noted.  No changes consistent with cellulitis.   CBC Lab Results  Component Value Date   WBC 6.3 12/09/2019   HGB 11.9 (L) 12/09/2019   HCT 36.0 12/09/2019   MCV 83.7 12/09/2019   PLT 145 (L) 12/09/2019    BMET    Component Value Date/Time   NA 135 10/17/2020 1155   NA 137 03/02/2013 2147   K 4.3 10/17/2020 1155   K 3.8 03/02/2013  2147   CL 102 10/17/2020 1155   CL 103 03/02/2013 2147   CO2 25 10/17/2020 1155   CO2 29 03/02/2013 2147   GLUCOSE 86 10/17/2020 1155   GLUCOSE 195 (H) 03/02/2013 2147   BUN 18 10/17/2020 1155   BUN 14 03/02/2013 2147   CREATININE 0.67 10/17/2020 1155   CREATININE 0.83 03/02/2013 2147   CALCIUM  9.5 10/17/2020 1155   CALCIUM  9.9 03/02/2013 2147   GFRNONAA >60 10/17/2020 1155   GFRNONAA >60 03/02/2013 2147   GFRAA >60 12/08/2019 0439   GFRAA >60 03/02/2013 2147   CrCl cannot be calculated (Patient's most recent lab result is older than the maximum 21 days allowed.).  COAG Lab Results  Component Value Date   INR 1.0 12/07/2019    Radiology US   Carotid Bilateral Result Date: 07/21/2024 CLINICAL DATA:  Ocular ischemic syndrome EXAM: BILATERAL CAROTID DUPLEX ULTRASOUND TECHNIQUE: Elnor scale imaging, color Doppler and duplex ultrasound were performed of bilateral carotid and vertebral arteries in the neck. COMPARISON:  CT of the chest performed June 11, 2008 FINDINGS: Criteria: Quantification of carotid stenosis is based on velocity parameters that correlate the residual internal carotid diameter with NASCET-based stenosis levels, using the diameter of the distal internal carotid lumen as the denominator for stenosis measurement. The following velocity measurements were obtained: RIGHT ICA: 41 cm/sec CCA: 26 cm/sec SYSTOLIC ICA/CCA RATIO:  1.6 ECA: 29 cm/sec LEFT ICA: 98 cm/sec CCA: 106 cm/sec SYSTOLIC ICA/CCA RATIO:  0.9 ECA: 93 cm/sec RIGHT CAROTID ARTERY: Mild atherosclerotic changes.  Patent. RIGHT VERTEBRAL ARTERY:  Retrograde. LEFT CAROTID ARTERY:  Mild atherosclerotic changes.  Patent. LEFT VERTEBRAL ARTERY:  Antegrade. Upper extremity blood pressures: RIGHT: 79/58 LEFT: 128/70 Incidental note is made of bilateral thyroid nodules. IMPRESSION: 1. Diminished blood pressure in the right arm with retrograde flow in the right vertebral artery. These findings suggest hemodynamically  significant atherosclerotic vascular disease in the right subclavian or innominate artery. Consideration can be given toward further evaluation by CT angiogram of the chest. 2. Right carotid system: Mild atherosclerotic changes with estimated stenosis in the proximal right internal carotid artery estimated at less than 50%. 3. Left carotid system: Mild atherosclerotic changes with estimated stenosis in the proximal left internal carotid artery estimated at less than 50%. 4. Bilateral thyroid nodules. Consider dedicated thyroid ultrasound for definitive characterization. Electronically Signed   By: Maude Naegeli M.D.   On: 07/21/2024 09:38   DG BONE DENSITY (DXA) Result Date: 07/03/2024 EXAM: DUAL X-RAY ABSORPTIOMETRY (DXA) FOR BONE MINERAL DENSITY 07/02/2024 2:56 pm CLINICAL DATA:  68 year old Female Postmenopausal. POST MENOPAUSAL History of fragility fracture. TECHNIQUE: An axial (e.g., hips, spine) and/or appendicular (e.g., radius) exam was performed, as appropriate, using GE Secretary/administrator at [employer] [facility]. Images are obtained for bone mineral density measurement and are not obtained for diagnostic purposes. [id number] Exclusions: Lumbar spine. COMPARISON:  None. FINDINGS: Scan quality: Good. LEFT FEMORAL NECK: BMD (in g/cm2): 0.669 T-score: -2.7 Z-score: -1.1 LEFT TOTAL HIP: BMD (in g/cm2): 0.810 T-score: -1.6 Z-score: -0.2 RIGHT FEMORAL NECK: BMD (in g/cm2): 0.700 T-score: -2.4 Z-score: -0.8 RIGHT TOTAL HIP: BMD (in g/cm2): 0.805 T-score: -1.6 Z-score: -0.3 RIGHT FOREARM (RADIUS 33%): BMD (in g/cm2): 0.666 T-score: -2.4 Z-score: -0.8 FRAX 10-YEAR PROBABILITY OF FRACTURE: FRAX not reported as the lowest BMD is not in the osteopenia range. IMPRESSION: Osteoporosis based on BMD. Fracture risk is increased. Increased risk is based on low BMD and history of fragility fracture. RECOMMENDATIONS: 1. All patients should optimize calcium  and vitamin D intake. 2. Consider FDA-approved  medical therapies in postmenopausal women and men aged 24 years and older, based on the following: - A hip or vertebral (clinical or morphometric) fracture - T-score less than or equal to -2.5 and secondary causes have been excluded. - Low bone mass (T-score between -1.0 and -2.5) and a 10-year probability of a hip fracture greater than or equal to 3% or a 10-year probability of a major osteoporosis-related fracture greater than or equal to 20% based on the [place] -adapted WHO algorithm. - Clinician judgment and/or patient preferences may indicate treatment for people with 10-year fracture probabilities above or below these levels 3. Patients with diagnosis of osteoporosis or at high risk for fracture should have regular bone mineral density tests. For patients eligible  for Medicare, routine testing is allowed once every 2 years. The testing frequency can be increased to one year for patients who have rapidly progressing disease, those who are receiving or discontinuing medical therapy to restore bone mass, or have additional risk factors. Electronically Signed   By: Harrietta Sherry M.D.   On: 07/03/2024 14:27     Assessment/Plan 1. Bilateral carotid artery stenosis (Primary) Recommend:  The patient is symptomatic with respect to the carotid stenosis.  Additionally, the patient has a proximal lesion the is >70%.  Patient should undergo CT angiography of the carotid arteries to define the degree of stenosis of the carotid arteries bilaterally, as well as the involvement of the innominate artery and the anatomic suitability for surgery vs. Intervention with stenting.  If the patient does indeed need surgery cardiac clearance will be required, once cleared the patient will be scheduled for surgery.  The risks, benefits and alternative therapies were reviewed in detail with the patient.  All questions were answered.  The patient agrees to proceed with imaging.  Continue antiplatelet therapy as  prescribed. Continue management of CAD, HTN and Hyperlipidemia. Healthy heart diet, encouraged exercise at least 4 times per week. - CT ANGIO HEAD NECK W WO CM; Future  2. Subclavian steal syndrome Recommend:  The patient is symptomatic with respect to the carotid stenosis.  Additionally, the patient has a proximal lesion the is >70%.  Patient should undergo CT angiography of the carotid arteries to define the degree of stenosis of the carotid arteries bilaterally, as well as the involvement of the innominate artery and the anatomic suitability for surgery vs. Intervention with stenting.  If the patient does indeed need surgery cardiac clearance will be required, once cleared the patient will be scheduled for surgery.  The risks, benefits and alternative therapies were reviewed in detail with the patient.  All questions were answered.  The patient agrees to proceed with imaging.  Continue antiplatelet therapy as prescribed. Continue management of CAD, HTN and Hyperlipidemia. Healthy heart diet, encouraged exercise at least 4 times per week. - CT ANGIO HEAD NECK W WO CM; Future  3. Chronic obstructive pulmonary disease, unspecified COPD type (HCC) Continue pulmonary medications and aerosols as already ordered, these medications have been reviewed and there are no changes at this time.   4. Type 2 diabetes mellitus with hyperlipidemia (HCC) Continue hypoglycemic medications as already ordered, these medications have been reviewed and there are no changes at this time.  Hgb A1C to be monitored as already arranged by primary service  5. Hyperlipidemia, unspecified hyperlipidemia type Continue statin as ordered and reviewed, no changes at this time    Cordella Shawl, MD  07/25/2024 12:25 PM

## 2024-07-27 ENCOUNTER — Ambulatory Visit (INDEPENDENT_AMBULATORY_CARE_PROVIDER_SITE_OTHER): Payer: Self-pay | Admitting: Vascular Surgery

## 2024-07-27 ENCOUNTER — Encounter (INDEPENDENT_AMBULATORY_CARE_PROVIDER_SITE_OTHER): Payer: Self-pay | Admitting: Vascular Surgery

## 2024-07-27 VITALS — BP 123/73 | HR 92 | Resp 18 | Ht 62.0 in | Wt 146.4 lb

## 2024-07-27 DIAGNOSIS — E785 Hyperlipidemia, unspecified: Secondary | ICD-10-CM | POA: Diagnosis not present

## 2024-07-27 DIAGNOSIS — E1169 Type 2 diabetes mellitus with other specified complication: Secondary | ICD-10-CM | POA: Diagnosis not present

## 2024-07-27 DIAGNOSIS — G458 Other transient cerebral ischemic attacks and related syndromes: Secondary | ICD-10-CM | POA: Diagnosis not present

## 2024-07-27 DIAGNOSIS — I6523 Occlusion and stenosis of bilateral carotid arteries: Secondary | ICD-10-CM | POA: Diagnosis not present

## 2024-07-27 DIAGNOSIS — J449 Chronic obstructive pulmonary disease, unspecified: Secondary | ICD-10-CM

## 2024-08-02 ENCOUNTER — Encounter (INDEPENDENT_AMBULATORY_CARE_PROVIDER_SITE_OTHER): Payer: Self-pay | Admitting: Vascular Surgery

## 2024-08-18 ENCOUNTER — Ambulatory Visit
Admission: RE | Admit: 2024-08-18 | Discharge: 2024-08-18 | Disposition: A | Discharge status: Home / Self Care | Source: Ambulatory Visit | Attending: Vascular Surgery | Admitting: Vascular Surgery

## 2024-08-18 DIAGNOSIS — G458 Other transient cerebral ischemic attacks and related syndromes: Secondary | ICD-10-CM | POA: Insufficient documentation

## 2024-08-18 DIAGNOSIS — I6523 Occlusion and stenosis of bilateral carotid arteries: Secondary | ICD-10-CM | POA: Diagnosis present

## 2024-08-18 MED ORDER — IOHEXOL 350 MG/ML SOLN
75.0000 mL | Freq: Once | INTRAVENOUS | Status: AC | PRN
Start: 2024-08-18 — End: 2024-08-18
  Administered 2024-08-18: 75 mL via INTRAVENOUS

## 2024-08-24 ENCOUNTER — Ambulatory Visit (INDEPENDENT_AMBULATORY_CARE_PROVIDER_SITE_OTHER): Admitting: Vascular Surgery

## 2024-08-24 ENCOUNTER — Encounter (INDEPENDENT_AMBULATORY_CARE_PROVIDER_SITE_OTHER): Payer: Self-pay | Admitting: Vascular Surgery

## 2024-08-24 VITALS — BP 120/77 | HR 82 | Resp 16 | Ht 62.5 in | Wt 145.8 lb

## 2024-08-24 DIAGNOSIS — G458 Other transient cerebral ischemic attacks and related syndromes: Secondary | ICD-10-CM | POA: Diagnosis not present

## 2024-08-24 DIAGNOSIS — I6523 Occlusion and stenosis of bilateral carotid arteries: Secondary | ICD-10-CM

## 2024-08-24 DIAGNOSIS — I10 Essential (primary) hypertension: Secondary | ICD-10-CM | POA: Diagnosis not present

## 2024-08-24 DIAGNOSIS — J449 Chronic obstructive pulmonary disease, unspecified: Secondary | ICD-10-CM | POA: Diagnosis not present

## 2024-08-24 DIAGNOSIS — E1169 Type 2 diabetes mellitus with other specified complication: Secondary | ICD-10-CM

## 2024-08-24 DIAGNOSIS — E785 Hyperlipidemia, unspecified: Secondary | ICD-10-CM

## 2024-08-24 NOTE — Progress Notes (Unsigned)
 MRN : [id number]  Dawn Farrell is a 68 y.o. (1956-09-08) female who presents with chief complaint of check carotid arteries.  History of Present Illness:   The patient is seen for follow up evaluation of carotid stenosis status post CT angiogram. CT scan was done August 18, 2024 patient.  Patient reports that the test went well with no problems or complications.   The patient denies interval amaurosis fugax. There is no recent or interval TIA symptoms or focal motor deficits. There is no prior documented CVA.  The patient is taking enteric-coated aspirin  81 mg daily.  There is no history of migraine headaches. There is no history of seizures.  No recent shortening of the patient's walking distance or new symptoms consistent with claudication.  No history of rest pain symptoms. No new ulcers or wounds of the lower extremities have occurred.  There is no history of DVT, PE or superficial thrombophlebitis. No recent episodes of angina or shortness of breath documented.   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  Given the occlusion of the internal carotid artery she is not at risk for embolic events from her innominate stenosis or the carotid lesion.    No outpatient medications have been marked as taking for the 08/24/24 encounter (Appointment) with Jama, Cordella MATSU, MD.    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   PONV (postoperative nausea and vomiting)    ponv after recent carpal tunnell   Renal  calculi    SVT (supraventricular tachycardia)     Past Surgical History:  Procedure Laterality Date   ABDOMINAL HYSTERECTOMY     CESAREAN SECTION     CHOLECYSTECTOMY     COLONOSCOPY WITH PROPOFOL  N/A 01/15/2018   Procedure: COLONOSCOPY WITH PROPOFOL ;  Surgeon: Toledo, Ladell POUR, MD;  Location: [facility];  Service: Gastroenterology;  Laterality: N/A;   COLONOSCOPY WITH PROPOFOL  N/A 10/09/2023   Procedure: COLONOSCOPY WITH PROPOFOL ;  Surgeon: Toledo, Ladell POUR, MD;  Location: [facility];  Service: Gastroenterology;  Laterality: N/A;  DM   HARDWARE REMOVAL Left 10/19/2020   Procedure: OPEN CARPAL TUNNEL RELEASE OF LEFT WRIST;  Surgeon: Edie Norleen PARAS, MD;  Location: [facility];  Service: Orthopedics;  Laterality: Left;   HARDWARE REMOVAL Left 11/24/2020   Procedure: HARDWARE REMOVAL;  Surgeon: Edie Norleen PARAS, MD;  Location: [facility];  Service: Orthopedics;  Laterality: Left;   HERNIA REPAIR     Oral surgery - all teeth removed     POLYPECTOMY  10/09/2023   Procedure: POLYPECTOMY;  Surgeon: Toledo, Teodoro K, MD;  Location: [facility];  Service: Gastroenterology;;    Social History Social History   Tobacco Use   Smoking status: Every Day    Current packs/day: 0.25    Types: Cigarettes   Smokeless tobacco: Never  Vaping Use   Vaping status: Never Used  Substance Use Topics   Alcohol use: No   Drug use: No    Family History Family History  Problem  Relation Age of Onset   Heart failure Mother    Heart failure Father    Breast cancer Sister 29    Allergies  Allergen Reactions   Cephalexin  Anaphylaxis and Itching   Latex Itching and Rash     REVIEW OF SYSTEMS (Negative unless checked)  Constitutional: [] Weight loss  [] Fever  [] Chills Cardiac: [] Chest pain   [] Chest pressure   [] Palpitations   [] Shortness of breath when laying flat   [] Shortness of breath with exertion. Vascular:  [x] Pain in legs with walking   [] Pain in legs at rest  [] History of DVT    [] Phlebitis   [] Swelling in legs   [] Varicose veins   [] Non-healing ulcers Pulmonary:   [] Uses home oxygen   [] Productive cough   [] Hemoptysis   [] Wheeze  [] COPD   [] Asthma Neurologic:  [] Dizziness   [] Seizures   [] History of stroke   [] History of TIA  [] Aphasia   [] Vissual changes   [] Weakness or numbness in arm   [] Weakness or numbness in leg Musculoskeletal:   [] Joint swelling   [] Joint pain   [] Low back pain Hematologic:  [] Easy bruising  [] Easy bleeding   [] Hypercoagulable state   [] Anemic Gastrointestinal:  [] Diarrhea   [] Vomiting  [] Gastroesophageal reflux/heartburn   [] Difficulty swallowing. Genitourinary:  [] Chronic kidney disease   [] Difficult urination  [] Frequent urination   [] Blood in urine Skin:  [] Rashes   [] Ulcers  Psychological:  [] History of anxiety   []  History of major depression.  Physical Examination  There were no vitals filed for this visit. There is no height or weight on file to calculate BMI. Gen: WD/WN, NAD Head: [place]/AT, No temporalis wasting.  Ear/Nose/Throat: Hearing grossly intact, nares w/o erythema or drainage Eyes: PER, EOMI, sclera nonicteric.  Neck: Supple, no masses.  No bruit or JVD.  Pulmonary:  Good air movement, no audible wheezing, no use of accessory muscles.  Cardiac: RRR, normal S1, S2, no Murmurs. Vascular:  carotid bruit noted Vessel Right Left  Radial Palpable Palpable  Carotid Not palpable  Palpable  Subclav Not palpable Palpable  Gastrointestinal: soft, non-distended. No guarding/no peritoneal signs.  Musculoskeletal: M/S 5/5 throughout.  No visible deformity.  Neurologic: CN 2-12 intact. Pain and light touch intact in extremities.  Symmetrical.  Speech is fluent. Motor exam as listed above. Psychiatric: Judgment intact, Mood & affect appropriate for pt's clinical situation. Dermatologic: No rashes or ulcers noted.  No changes consistent with cellulitis.   CBC Lab Results  Component Value Date   WBC 6.3 12/09/2019   HGB 11.9 (L)  12/09/2019   HCT 36.0 12/09/2019   MCV 83.7 12/09/2019   PLT 145 (L) 12/09/2019    BMET    Component Value Date/Time   NA 135 10/17/2020 1155   NA 137 03/02/2013 2147   K 4.3 10/17/2020 1155   K 3.8 03/02/2013 2147   CL 102 10/17/2020 1155   CL 103 03/02/2013 2147   CO2 25 10/17/2020 1155   CO2 29 03/02/2013 2147   GLUCOSE 86 10/17/2020 1155   GLUCOSE 195 (H) 03/02/2013 2147   BUN 18 10/17/2020 1155   BUN 14 03/02/2013 2147   CREATININE 0.67 10/17/2020 1155   CREATININE 0.83 03/02/2013 2147   CALCIUM  9.5 10/17/2020 1155   CALCIUM  9.9 03/02/2013 2147   GFRNONAA >60 10/17/2020 1155   GFRNONAA >60 03/02/2013 2147   GFRAA >60 12/08/2019 0439   GFRAA >60 03/02/2013 2147   CrCl cannot be calculated (Patient's most recent lab result is older than the  maximum 21 days allowed.).  COAG Lab Results  Component Value Date   INR 1.0 12/07/2019    Radiology CT ANGIO HEAD NECK W WO CM Result Date: 08/23/2024 EXAM: CTA HEAD AND NECK WITH AND WITHOUT 08/18/2024 09:06:23 AM TECHNIQUE: CTA of the head and neck was performed with and without the administration of intravenous contrast. Multiplanar 2D and/or 3D reformatted images are provided for review. Automated exposure control, iterative reconstruction, and/or weight based adjustment of the mA/kV was utilized to reduce the radiation dose to as low as reasonably achievable. Stenosis of the internal carotid arteries measured using NASCET criteria. CONTRAST: 75 mL of Omnipaque  350. COMPARISON: None available CLINICAL HISTORY: Ophthalmoplegia, vision loss, monocular. FINDINGS: CTA NECK: AORTIC ARCH AND ARCH VESSELS: There is mild calcific plaque present within the aortic arch. There is extensive calcific plaque within the proximal brachiocephalic artery with occlusive or near occlusive stenosis. There is reconstitution of the brachiocephalic artery at the takeoff of the common carotid artery. No dissection or arterial injury. No significant stenosis  of the subclavian arteries. CERVICAL CAROTID ARTERIES: There is moderate calcific plaque present within the right carotid bulb resulting in 30% luminal stenosis. There is gradual tapering of the cervical segment of the right internal carotid artery with occlusive stenosis proximal to the skull base. The cranial and cavernous segments of the right internal carotid artery are occluded. The ophthalmic and communicating segments of the right internal carotid artery are patent, but moderately stenotic. There is mild calcific plaque at the origin of the left common carotid artery with 30% luminal stenosis. There is mild-to-moderate calcific plaque within the left carotid bulb with less than 20% luminal stenosis. The cranial and cavernous segments of the left internal carotid artery are widely patent. No dissection or arterial injury. CERVICAL VERTEBRAL ARTERIES: The vertebral arteries are codominant. No dissection, arterial injury, or significant stenosis. LUNGS AND MEDIASTINUM: There is mild-to-moderate emphysema present. Calcified granulomas present in the superior segment of the left lower lobe. SOFT TISSUES: There is an ovoid calcified nodule within the frontal scalp just to the right of midline measuring approximately 12 mm in diameter. There are numerous mixed density thyroid nodules. Recommend correlation with follow-up thyroid ultrasound. BONES: No acute abnormality. CTA HEAD: ANTERIOR CIRCULATION: The anterior and middle cerebral arteries and their branches appear normal in caliber. No aneurysm. POSTERIOR CIRCULATION: The vertebral basilar system is unremarkable. The posterior cerebral arteries and their branches appear normal in caliber. No aneurysm. OTHER: There is mild-to-moderate mucosal disease within the paranasal sinuses. There is leftward deviation of the nasal septum. No dural venous sinus thrombosis on this non-dedicated study. IMPRESSION: 1. Extensive calcific plaque within the proximal brachiocephalic  artery with occlusive or near-occlusive stenosis and reconstitution at the takeoff of the common carotid artery. 2. Right internal carotid artery: gradual tapering of the cervical segment with occlusive stenosis proximal to the skull base; cavernous and cranial segments occluded; ophthalmic and communicating segments patent but moderately stenotic. 3. Moderate calcific plaque within the right carotid bulb resulting in approximately 30% luminal stenosis. 4. Mild calcific plaque at the origin of the left common carotid artery with approximately 30% luminal stenosis. 5. Incidental emphysema. Given emphysema and typical screening age, consider evaluation for low-dose CT lung cancer screening program. 6. Mild-to-moderate paranasal sinus mucosal disease and leftward nasal septal deviation. 7. Incidental thyroid nodules. Recommend non-emergent thyroid ultrasound for further characterization. Electronically signed by: Evalene Coho MD 08/23/2024 06:46 AM EST RP Workstation: [id number]     Assessment/Plan 1. Subclavian steal syndrome (  Primary) Recommend:  Given the patient's asymptomatic stenosis no further invasive testing or surgery at this time.  CT angio shows occlusion of the end [place] artery and distal internal carotid this is a stable lesion no intervention indicated this was discussed extensively with the patient she is in agreement.  We will continue to follow the left side with duplex ultrasound.  Continue antiplatelet therapy as prescribed Continue management of CAD, HTN and Hyperlipidemia Healthy heart diet,  encouraged exercise at least 4 times per week  Follow up in 6 months with duplex ultrasound and physical exam   2. Bilateral carotid artery stenosis Recommend:  Given the patient's asymptomatic stenosis no further invasive testing or surgery at this time.  CT angio shows occlusion of the end [place] artery and distal internal carotid this is a stable lesion no intervention  indicated this was discussed extensively with the patient she is in agreement.  We will continue to follow the left side with duplex ultrasound.  Continue antiplatelet therapy as prescribed Continue management of CAD, HTN and Hyperlipidemia Healthy heart diet,  encouraged exercise at least 4 times per week  Follow up in 6 months with duplex ultrasound and physical exam   3. Primary hypertension Continue antihypertensive medications as already ordered, these medications have been reviewed and there are no changes at this time.  4. Chronic obstructive pulmonary disease, unspecified COPD type (HCC) Continue pulmonary medications and aerosols as already ordered, these medications have been reviewed and there are no changes at this time.   5. Type 2 diabetes mellitus with hyperlipidemia (HCC) Continue hypoglycemic medications as already ordered, these medications have been reviewed and there are no changes at this time.  Hgb A1C to be monitored as already arranged by primary service  6. Hyperlipidemia, unspecified hyperlipidemia type Continue statin as ordered and reviewed, no changes at this time    Cordella Shawl, MD  08/24/2024 12:12 PM

## 2024-08-26 ENCOUNTER — Encounter (INDEPENDENT_AMBULATORY_CARE_PROVIDER_SITE_OTHER): Payer: Self-pay | Admitting: Vascular Surgery

## 2025-08-24 ENCOUNTER — Encounter (INDEPENDENT_AMBULATORY_CARE_PROVIDER_SITE_OTHER)

## 2025-08-24 ENCOUNTER — Ambulatory Visit (INDEPENDENT_AMBULATORY_CARE_PROVIDER_SITE_OTHER): Admitting: Nurse Practitioner
